# Patient Record
Sex: Female | Born: 1945 | Race: Black or African American | Hispanic: No | Smoking: Never smoker
Health system: Southern US, Community
[De-identification: ages and names within clinical notes are randomized; demographics above are authoritative.]

## PROBLEM LIST (undated history)

## (undated) DIAGNOSIS — E785 Hyperlipidemia, unspecified: Secondary | ICD-10-CM

## (undated) DIAGNOSIS — I609 Nontraumatic subarachnoid hemorrhage, unspecified: Secondary | ICD-10-CM

## (undated) DIAGNOSIS — Z972 Presence of dental prosthetic device (complete) (partial): Secondary | ICD-10-CM

## (undated) DIAGNOSIS — C801 Malignant (primary) neoplasm, unspecified: Secondary | ICD-10-CM

## (undated) DIAGNOSIS — E7800 Pure hypercholesterolemia, unspecified: Secondary | ICD-10-CM

## (undated) DIAGNOSIS — Z9889 Other specified postprocedural states: Secondary | ICD-10-CM

## (undated) DIAGNOSIS — I2692 Saddle embolus of pulmonary artery without acute cor pulmonale: Secondary | ICD-10-CM

## (undated) DIAGNOSIS — M109 Gout, unspecified: Secondary | ICD-10-CM

## (undated) DIAGNOSIS — J189 Pneumonia, unspecified organism: Secondary | ICD-10-CM

## (undated) DIAGNOSIS — D649 Anemia, unspecified: Secondary | ICD-10-CM

## (undated) DIAGNOSIS — Z974 Presence of external hearing-aid: Secondary | ICD-10-CM

## (undated) DIAGNOSIS — Z8719 Personal history of other diseases of the digestive system: Secondary | ICD-10-CM

## (undated) DIAGNOSIS — I82409 Acute embolism and thrombosis of unspecified deep veins of unspecified lower extremity: Secondary | ICD-10-CM

## (undated) DIAGNOSIS — M542 Cervicalgia: Secondary | ICD-10-CM

## (undated) DIAGNOSIS — M1990 Unspecified osteoarthritis, unspecified site: Secondary | ICD-10-CM

## (undated) DIAGNOSIS — Z87442 Personal history of urinary calculi: Secondary | ICD-10-CM

## (undated) DIAGNOSIS — R112 Nausea with vomiting, unspecified: Secondary | ICD-10-CM

## (undated) DIAGNOSIS — I639 Cerebral infarction, unspecified: Secondary | ICD-10-CM

## (undated) HISTORY — PX: IVC FILTER PLACEMENT (ARMC HX): HXRAD1551

## (undated) HISTORY — PX: REPLACEMENT TOTAL KNEE: SUR1224

## (undated) HISTORY — PX: CHOLECYSTECTOMY: SHX55

## (undated) HISTORY — PX: CATARACT EXTRACTION W/ INTRAOCULAR LENS IMPLANT: SHX1309

---

## 1998-07-23 HISTORY — PX: REPLACEMENT TOTAL KNEE: SUR1224

## 2004-07-21 ENCOUNTER — Emergency Department: Payer: Self-pay | Admitting: Emergency Medicine

## 2004-07-21 ENCOUNTER — Ambulatory Visit: Payer: Self-pay | Admitting: Family Medicine

## 2006-12-24 ENCOUNTER — Ambulatory Visit: Payer: Self-pay | Admitting: Internal Medicine

## 2008-02-24 ENCOUNTER — Ambulatory Visit: Payer: Self-pay | Admitting: Internal Medicine

## 2008-03-16 ENCOUNTER — Ambulatory Visit: Payer: Self-pay | Admitting: Gastroenterology

## 2009-04-13 ENCOUNTER — Ambulatory Visit: Payer: Self-pay | Admitting: Internal Medicine

## 2009-07-23 HISTORY — PX: CERVICAL BIOPSY  W/ LOOP ELECTRODE EXCISION: SUR135

## 2010-01-11 ENCOUNTER — Emergency Department: Payer: Self-pay | Admitting: Emergency Medicine

## 2010-06-07 ENCOUNTER — Ambulatory Visit: Payer: Self-pay | Admitting: Internal Medicine

## 2010-11-17 ENCOUNTER — Inpatient Hospital Stay: Payer: Self-pay | Admitting: Internal Medicine

## 2012-01-09 DIAGNOSIS — C539 Malignant neoplasm of cervix uteri, unspecified: Secondary | ICD-10-CM | POA: Insufficient documentation

## 2012-07-17 ENCOUNTER — Ambulatory Visit: Payer: Self-pay | Admitting: Internal Medicine

## 2012-11-20 ENCOUNTER — Ambulatory Visit: Payer: Self-pay | Admitting: Ophthalmology

## 2012-11-20 DIAGNOSIS — I10 Essential (primary) hypertension: Secondary | ICD-10-CM

## 2012-11-20 LAB — POTASSIUM: Potassium: 3.5 mmol/L (ref 3.5–5.1)

## 2012-12-02 ENCOUNTER — Ambulatory Visit: Payer: Self-pay | Admitting: Ophthalmology

## 2013-07-20 ENCOUNTER — Ambulatory Visit: Payer: Self-pay | Admitting: Internal Medicine

## 2013-07-26 ENCOUNTER — Emergency Department: Payer: Self-pay | Admitting: Emergency Medicine

## 2014-06-03 ENCOUNTER — Ambulatory Visit: Payer: Self-pay | Admitting: Internal Medicine

## 2014-06-04 ENCOUNTER — Other Ambulatory Visit (HOSPITAL_COMMUNITY): Payer: Self-pay | Admitting: Cardiology

## 2014-06-04 DIAGNOSIS — R0600 Dyspnea, unspecified: Secondary | ICD-10-CM

## 2014-06-07 ENCOUNTER — Ambulatory Visit: Payer: Self-pay | Admitting: Internal Medicine

## 2014-06-08 ENCOUNTER — Other Ambulatory Visit (INDEPENDENT_AMBULATORY_CARE_PROVIDER_SITE_OTHER): Payer: Medicare Other

## 2014-06-08 ENCOUNTER — Other Ambulatory Visit: Payer: Self-pay

## 2014-06-08 DIAGNOSIS — R0602 Shortness of breath: Secondary | ICD-10-CM

## 2014-06-08 DIAGNOSIS — R0600 Dyspnea, unspecified: Secondary | ICD-10-CM

## 2014-06-14 ENCOUNTER — Ambulatory Visit: Payer: Self-pay | Admitting: Gastroenterology

## 2014-07-02 DIAGNOSIS — E669 Obesity, unspecified: Secondary | ICD-10-CM | POA: Insufficient documentation

## 2014-07-02 DIAGNOSIS — Z9889 Other specified postprocedural states: Secondary | ICD-10-CM | POA: Insufficient documentation

## 2014-07-02 DIAGNOSIS — Z96659 Presence of unspecified artificial knee joint: Secondary | ICD-10-CM | POA: Insufficient documentation

## 2014-07-02 HISTORY — PX: HIATAL HERNIA REPAIR: SHX195

## 2014-07-02 HISTORY — PX: NISSEN FUNDOPLICATION: SHX2091

## 2014-07-31 DIAGNOSIS — I82409 Acute embolism and thrombosis of unspecified deep veins of unspecified lower extremity: Secondary | ICD-10-CM

## 2014-07-31 DIAGNOSIS — I2692 Saddle embolus of pulmonary artery without acute cor pulmonale: Secondary | ICD-10-CM | POA: Insufficient documentation

## 2014-07-31 HISTORY — DX: Saddle embolus of pulmonary artery without acute cor pulmonale: I26.92

## 2014-07-31 HISTORY — DX: Acute embolism and thrombosis of unspecified deep veins of unspecified lower extremity: I82.409

## 2014-08-07 DIAGNOSIS — Z95828 Presence of other vascular implants and grafts: Secondary | ICD-10-CM | POA: Insufficient documentation

## 2014-11-12 NOTE — Op Note (Signed)
PATIENT NAME:  Tasha Wilson, VANDERKOLK MR#:  [id number] DATE OF BIRTH:  08/17/1945  DATE OF PROCEDURE:  12/02/2012  PREOPERATIVE DIAGNOSIS: Visually significant cataract of the left eye.   POSTOPERATIVE DIAGNOSIS: Visually significant cataract of the left eye.   OPERATIVE PROCEDURE: Cataract extraction by phacoemulsification with implant of intraocular lens to left eye.   SURGEON: Birder Robson, MD   ANESTHESIA:  1. Managed anesthesia care.  2. Topical tetracaine drops followed by 2% Xylocaine jelly applied in the preoperative holding area.   COMPLICATIONS: None.   TECHNIQUE:  Stop and chop.   DESCRIPTION OF PROCEDURE: The patient was examined and consented in the preoperative holding area where the aforementioned topical anesthesia was applied to the left eye and then brought back to the Operating Room where the left eye was prepped and draped in the usual sterile ophthalmic fashion and a lid speculum was placed. A paracentesis was created with the side port blade and the anterior chamber was filled with viscoelastic. A near clear corneal incision was performed with the steel keratome. A continuous curvilinear capsulorrhexis was performed with a cystotome followed by the capsulorrhexis forceps. Hydrodissection and hydrodelineation were carried out with BSS on a blunt cannula. The lens was removed in a stop and chop technique and the remaining cortical material was removed with the irrigation-aspiration handpiece. The capsular bag was inflated with viscoelastic and the Tecnis ZCBOO   18.0-diopter lens, serial number [id number], was placed in the capsular bag without complication. The remaining viscoelastic was removed from the eye with the irrigation-aspiration handpiece. The wounds were hydrated. The anterior chamber was flushed with Miostat and the eye was inflated to physiologic pressure. 0.1 mL of cefuroxime concentration 10 mg/mL was placed in the anterior chamber. The wounds were found to be  water tight. The eye was dressed with Vigamox. The patient was given protective glasses to wear throughout the day and a shield with which to sleep tonight. The patient was also given drops with which to begin a drop regimen today and will follow-up with me in one day.   ____________________________ Livingston Diones. Analeia Ismael, MD wlp:cb D: 12/02/2012 20:53:02 ET T: 12/02/2012 21:56:18 ET JOB#: [id number]  cc: Gilda Abboud L. Darby Shadwick, MD, <Dictator> Livingston Diones Smith Potenza MD ELECTRONICALLY SIGNED 12/05/2012 9:00

## 2015-04-17 ENCOUNTER — Encounter: Payer: Self-pay | Admitting: Emergency Medicine

## 2015-04-17 ENCOUNTER — Emergency Department: Payer: Medicare HMO

## 2015-04-17 ENCOUNTER — Emergency Department
Admission: EM | Admit: 2015-04-17 | Discharge: 2015-04-17 | Disposition: A | Discharge status: Home / Self Care | Payer: Medicare HMO | Attending: Emergency Medicine | Admitting: Emergency Medicine

## 2015-04-17 DIAGNOSIS — R42 Dizziness and giddiness: Secondary | ICD-10-CM | POA: Diagnosis present

## 2015-04-17 DIAGNOSIS — R55 Syncope and collapse: Secondary | ICD-10-CM | POA: Diagnosis not present

## 2015-04-17 HISTORY — DX: Malignant (primary) neoplasm, unspecified: C80.1

## 2015-04-17 HISTORY — DX: Pure hypercholesterolemia, unspecified: E78.00

## 2015-04-17 HISTORY — DX: Anemia, unspecified: D64.9

## 2015-04-17 LAB — FIBRIN DERIVATIVES D-DIMER (ARMC ONLY): FIBRIN DERIVATIVES D-DIMER (ARMC): 299 (ref 0–499)

## 2015-04-17 LAB — COMPREHENSIVE METABOLIC PANEL
ALT: 21 U/L (ref 14–54)
ANION GAP: 9 (ref 5–15)
AST: 29 U/L (ref 15–41)
Albumin: 3.9 g/dL (ref 3.5–5.0)
Alkaline Phosphatase: 98 U/L (ref 38–126)
BUN: 21 mg/dL — ABNORMAL HIGH (ref 6–20)
CALCIUM: 9.6 mg/dL (ref 8.9–10.3)
CHLORIDE: 102 mmol/L (ref 101–111)
CO2: 26 mmol/L (ref 22–32)
CREATININE: 0.94 mg/dL (ref 0.44–1.00)
Glucose, Bld: 122 mg/dL — ABNORMAL HIGH (ref 65–99)
Potassium: 3.8 mmol/L (ref 3.5–5.1)
SODIUM: 137 mmol/L (ref 135–145)
Total Bilirubin: 0.7 mg/dL (ref 0.3–1.2)
Total Protein: 8.1 g/dL (ref 6.5–8.1)

## 2015-04-17 LAB — CBC WITH DIFFERENTIAL/PLATELET
BASOS PCT: 0 %
Basophils Absolute: 0 10*3/uL (ref 0–0.1)
EOS ABS: 0.1 10*3/uL (ref 0–0.7)
Eosinophils Relative: 1 %
HEMATOCRIT: 45.3 % (ref 35.0–47.0)
HEMOGLOBIN: 15 g/dL (ref 12.0–16.0)
LYMPHS ABS: 0.9 10*3/uL — AB (ref 1.0–3.6)
Lymphocytes Relative: 14 %
MCH: 29.7 pg (ref 26.0–34.0)
MCHC: 33.1 g/dL (ref 32.0–36.0)
MCV: 89.6 fL (ref 80.0–100.0)
Monocytes Absolute: 0.5 10*3/uL (ref 0.2–0.9)
Monocytes Relative: 8 %
NEUTROS ABS: 5 10*3/uL (ref 1.4–6.5)
NEUTROS PCT: 77 %
Platelets: 190 10*3/uL (ref 150–440)
RBC: 5.06 MIL/uL (ref 3.80–5.20)
RDW: 14.5 % (ref 11.5–14.5)
WBC: 6.4 10*3/uL (ref 3.6–11.0)

## 2015-04-17 LAB — PROTIME-INR
INR: 2.21
PROTHROMBIN TIME: 24.7 s — AB (ref 11.4–15.0)

## 2015-04-17 LAB — TROPONIN I

## 2015-04-17 MED ORDER — IOHEXOL 350 MG/ML SOLN
75.0000 mL | Freq: Once | INTRAVENOUS | Status: AC | PRN
  Administered 2015-04-17: 75 mL via INTRAVENOUS

## 2015-04-17 NOTE — ED Notes (Signed)
Pt presents to ED via [facility] from home c/o dizziness that started this morning around 0700. EMS reports pt unable to ambulate d/t feeling lightheaded but was able to stand and transfer. 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 Pt on coumadin. Denies pain.

## 2015-04-17 NOTE — ED Notes (Signed)
Discharge instructions and follow-up care reviewed with patient. No questions or concerns at this time. Pt stable at discharge.

## 2015-04-17 NOTE — ED Provider Notes (Signed)
Time Seen: Approximately 1205 I have reviewed the triage notes  Chief Complaint: Dizziness   History of Present Illness: Tasha Wilson is a 69 y.o. female who presents with feelings of lightheadedness without vertiginous symptoms. Patient was concerned because last time that she had similar symptoms that turned out to be a pulmonary embolism. Patient denies any chest pain or shortness of breath on this occasion the last time that she had a pulmonary embolism. She has been on Coumadin therapy since February of this year. She states she just had some adjustments on her Coumadin as an outpatient. She has history of anemia and denies any melena, hematochezia. She denies any chest pain, nausea, vomiting, fever. She denies any focal weakness in either upper or lower extremities. She denies any syncopal episode.   Past Medical History  Diagnosis Date  . Anemia   . High cholesterol   . Cancer     There are no active problems to display for this patient.   Past Surgical History  Procedure Laterality Date  . Replacement total knee Left     Past Surgical History  Procedure Laterality Date  . Replacement total knee Left     No current outpatient prescriptions on file.  Allergies:  Review of patient's allergies indicates no known allergies.  Family History: History reviewed. No pertinent family history.  Social History: Social History  Substance Use Topics  . Smoking status: Never Smoker   . Smokeless tobacco: None  . Alcohol Use: No     Review of Systems:   10 point review of systems was performed and was otherwise negative:  Constitutional: No fever Eyes: No visual disturbances ENT: No sore throat, ear pain Cardiac: No chest pain Respiratory: No shortness of breath, wheezing, or stridor Abdomen: No abdominal pain, no vomiting, No diarrhea Endocrine: No weight loss, No night sweats Extremities: No peripheral edema, cyanosis Skin: No rashes, easy  bruising Neurologic: No focal weakness, trouble with speech or swollowing Urologic: No dysuria, Hematuria, or urinary frequency   Physical Exam:  ED Triage Vitals  Enc Vitals Group     BP 04/17/15 1119 142/89 mmHg     Pulse Rate 04/17/15 1120 75     Resp 04/17/15 1119 12     Temp 04/17/15 1120 97.5 F (36.4 C)     Temp Source 04/17/15 1120 Oral     SpO2 04/17/15 1120 98 %     Weight 04/17/15 1120 252 lb 4.8 oz (114.443 kg)     Height 04/17/15 1120 5\' 5"  (1.651 m)     Head Cir --      Peak Flow --      Pain Score --      Pain Loc --      Pain Edu? --      Excl. in [place]? --     General: Awake , Alert , and Oriented times 3; GCS 15 Head: Normal cephalic , atraumatic Eyes: Pupils equal , round, reactive to light Nose/Throat: No nasal drainage, patent upper airway without erythema or exudate.  Neck: Supple, Full range of motion, No anterior adenopathy or palpable thyroid masses Lungs: Clear to ascultation without wheezes , rhonchi, or rales Heart: Regular rate, regular rhythm without murmurs , gallops , or rubs Abdomen: Soft, non tender without rebound, guarding , or rigidity; bowel sounds positive and symmetric in all 4 quadrants. No organomegaly .        Extremities: 2 plus symmetric pulses. No edema, clubbing or cyanosis Neurologic: normal  ambulation, Motor symmetric without deficits, sensory intact Skin: warm, dry, no rashes   Labs:   All laboratory work was reviewed including any pertinent negatives or positives listed below:  Labs Reviewed  COMPREHENSIVE METABOLIC PANEL - Abnormal; Notable for the following:    Glucose, Bld 122 (*)    BUN 21 (*)    All other components within normal limits  CBC WITH DIFFERENTIAL/PLATELET - Abnormal; Notable for the following:    Lymphs Abs 0.9 (*)    All other components within normal limits  PROTIME-INR - Abnormal; Notable for the following:    Prothrombin Time 24.7 (*)    All other components within normal limits  TROPONIN I   FIBRIN DERIVATIVES D-DIMER ([facility] ONLY)   review laboratory work shows a slightly elevated d-dimer level. Otherwise her INR is 2.21 which is therapeutic.  EKG:  ED ECG REPORT I, Daymon Larsen, the attending physician, personally viewed and interpreted this ECG.  Date: 04/17/2015 EKG Time: 1128 Rate: 71 Rhythm: normal sinus rhythm QRS Axis: Low voltage QRS Intervals: normal ST/T Wave abnormalities: Nonspecific T wave abnormality Conduction Disutrbances: none Narrative Interpretation: unremarkable No acute ischemic changes  Radiology  CT ANGIOGRAPHY CHEST WITH CONTRAST  TECHNIQUE: Multidetector CT imaging of the chest was performed using the standard protocol during bolus administration of intravenous contrast. Multiplanar CT image reconstructions and MIPs were obtained to evaluate the vascular anatomy.  CONTRAST: 26mL OMNIPAQUE IOHEXOL 350 MG/ML SOLN  COMPARISON: CTA chest dated 06/03/2014  FINDINGS: No evidence of pulmonary embolism.  Mediastinum/Nodes: Heart is normal in size. No pericardial effusion.  Three-vessel coronary atherosclerosis.  Mild atherosclerotic calcifications of the aortic arch.  No suspicious mediastinal, hilar, or axillary lymphadenopathy.  Visualized thyroid is unremarkable.  Lungs/Pleura: Lungs are clear.  No suspicious pulmonary nodules.  Mild dependent atelectasis in the medial right lower lobe. No focal consolidation.  No pleural effusion or pneumothorax.  Upper abdomen: Visualized upper abdomen is notable for cholecystectomy clips and postsurgical changes related to prior Nissen fundoplication.  Musculoskeletal: Degenerative changes of the visualized thoracolumbar spine.  Review of the MIP images confirms the above findings.  IMPRESSION: No evidence of pulmonary embolism.  No evidence of acute cardiopulmonary disease.   I personally reviewed the radiologic studies     ED Course:  Patient's differential for near  syncope is extensive. I felt this was unlikely to be a life-threatening disease at this time. There is no evidence of pulmonary embolism on CAT scan evaluation. Patient's not hypoxic, etc. She may be slightly dehydrated based on her history but otherwise seems to be in good physical condition at this time. Her dizziness did not seem to be vertiginous in nature. I felt this was unlikely to be cardiac arrhythmias, etc.   Assessment Near syncope   Final Clinical Impression:  Near syncope Final diagnoses:  Near syncope     Plan:  Outpatient management Patient was advised to return immediately if condition worsens. Patient was advised to follow up with her primary care physician or other specialized physicians involved and in their current assessment.            Daymon Larsen, MD 04/17/15 [phone]

## 2015-04-17 NOTE — Discharge Instructions (Signed)
Near-Syncope Near-syncope (commonly known as near fainting) is sudden weakness, dizziness, or feeling like you might pass out. During an episode of near-syncope, you may also develop pale skin, have tunnel vision, or feel sick to your stomach (nauseous). Near-syncope may occur when getting up after sitting or while standing for a long time. It is caused by a sudden decrease in blood flow to the brain. This decrease can result from various causes or triggers, most of which are not serious. However, because near-syncope can sometimes be a sign of something serious, a medical evaluation is required. The specific cause is often not determined. HOME CARE INSTRUCTIONS  Monitor your condition for any changes. The following actions may help to alleviate any discomfort you are experiencing:  Have someone stay with you until you feel stable.  Lie down right away and prop your feet up if you start feeling like you might faint. Breathe deeply and steadily. Wait until all the symptoms have passed. Most of these episodes last only a few minutes. You may feel tired for several hours.   Drink enough fluids to keep your urine clear or pale yellow.   If you are taking blood pressure or heart medicine, get up slowly when seated or lying down. Take several minutes to sit and then stand. This can reduce dizziness.  Follow up with your health care provider as directed. SEEK IMMEDIATE MEDICAL CARE IF:   You have a severe headache.   You have unusual pain in the chest, abdomen, or back.   You are bleeding from the mouth or rectum, or you have black or tarry stool.   You have an irregular or very fast heartbeat.   You have repeated fainting or have seizure-like jerking during an episode.   You faint when sitting or lying down.   You have confusion.   You have difficulty walking.   You have severe weakness.   You have vision problems.  MAKE SURE YOU:   Understand these instructions.  Will  watch your condition.  Will get help right away if you are not doing well or get worse. Document Released: 07/09/2005 Document Revised: 07/14/2013 Document Reviewed: 12/12/2012 [facility] Patient Information 2015 [place], [place]. This information is not intended to replace advice given to you by your health care provider. Make sure you discuss any questions you have with your health care provider.   Please return immediately if condition worsens. Please contact her primary physician or the physician you were given for referral. If you have any specialist physicians involved in her treatment and plan please also contact them. Thank you for using [place] [facility] emergency [facility]. INR was 2.21.

## 2015-07-01 ENCOUNTER — Other Ambulatory Visit: Payer: Self-pay | Admitting: Internal Medicine

## 2015-07-01 DIAGNOSIS — Z1231 Encounter for screening mammogram for malignant neoplasm of breast: Secondary | ICD-10-CM

## 2015-07-12 ENCOUNTER — Ambulatory Visit
Admission: RE | Admit: 2015-07-12 | Discharge: 2015-07-12 | Disposition: A | Discharge status: Home / Self Care | Payer: Medicare HMO | Source: Ambulatory Visit | Attending: Internal Medicine | Admitting: Internal Medicine

## 2015-07-12 ENCOUNTER — Other Ambulatory Visit: Payer: Self-pay | Admitting: Internal Medicine

## 2015-07-12 DIAGNOSIS — Z1231 Encounter for screening mammogram for malignant neoplasm of breast: Secondary | ICD-10-CM | POA: Insufficient documentation

## 2015-07-30 DIAGNOSIS — Z923 Personal history of irradiation: Secondary | ICD-10-CM | POA: Insufficient documentation

## 2015-07-30 DIAGNOSIS — IMO0002 Reserved for concepts with insufficient information to code with codable children: Secondary | ICD-10-CM | POA: Insufficient documentation

## 2015-12-13 ENCOUNTER — Emergency Department: Payer: No Typology Code available for payment source

## 2015-12-13 ENCOUNTER — Emergency Department
Admission: EM | Admit: 2015-12-13 | Discharge: 2015-12-13 | Disposition: A | Discharge status: Home / Self Care | Payer: No Typology Code available for payment source | Attending: Emergency Medicine | Admitting: Emergency Medicine

## 2015-12-13 ENCOUNTER — Encounter: Payer: Self-pay | Admitting: Emergency Medicine

## 2015-12-13 DIAGNOSIS — M545 Low back pain, unspecified: Secondary | ICD-10-CM

## 2015-12-13 DIAGNOSIS — S169XXA Unspecified injury of muscle, fascia and tendon at neck level, initial encounter: Secondary | ICD-10-CM | POA: Diagnosis present

## 2015-12-13 DIAGNOSIS — S161XXA Strain of muscle, fascia and tendon at neck level, initial encounter: Secondary | ICD-10-CM | POA: Diagnosis not present

## 2015-12-13 DIAGNOSIS — Y999 Unspecified external cause status: Secondary | ICD-10-CM | POA: Diagnosis not present

## 2015-12-13 DIAGNOSIS — Y9389 Activity, other specified: Secondary | ICD-10-CM | POA: Diagnosis not present

## 2015-12-13 DIAGNOSIS — Z8541 Personal history of malignant neoplasm of cervix uteri: Secondary | ICD-10-CM | POA: Diagnosis not present

## 2015-12-13 DIAGNOSIS — Y92410 Unspecified street and highway as the place of occurrence of the external cause: Secondary | ICD-10-CM | POA: Diagnosis not present

## 2015-12-13 DIAGNOSIS — Z7901 Long term (current) use of anticoagulants: Secondary | ICD-10-CM | POA: Diagnosis not present

## 2015-12-13 MED ORDER — TRAMADOL HCL 50 MG PO TABS
50.0000 mg | ORAL_TABLET | Freq: Once | ORAL | Status: AC
  Administered 2015-12-13: 50 mg via ORAL
  Filled 2015-12-13: qty 1

## 2015-12-13 MED ORDER — IBUPROFEN 600 MG PO TABS: 600.0000 mg | ORAL_TABLET | Freq: Four times a day (QID) | ORAL | Status: DC | PRN

## 2015-12-13 MED ORDER — CYCLOBENZAPRINE HCL 10 MG PO TABS
10.0000 mg | ORAL_TABLET | Freq: Once | ORAL | Status: AC
Start: 2015-12-13 — End: 2015-12-13
  Administered 2015-12-13: 10 mg via ORAL
  Filled 2015-12-13: qty 1

## 2015-12-13 MED ORDER — CYCLOBENZAPRINE HCL 10 MG PO TABS: 10.0000 mg | ORAL_TABLET | Freq: Three times a day (TID) | ORAL | Status: DC | PRN

## 2015-12-13 MED ORDER — TRAMADOL HCL 50 MG PO TABS: 50.0000 mg | ORAL_TABLET | Freq: Four times a day (QID) | ORAL | Status: DC | PRN
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
## 2015-12-27 ENCOUNTER — Other Ambulatory Visit: Payer: Self-pay

## 2015-12-27 ENCOUNTER — Telehealth: Payer: Self-pay | Admitting: Gastroenterology

## 2015-12-27 MED ORDER — PEG 3350-KCL-NABCB-NACL-NASULF 236 G PO SOLR: 4000.0000 mL | Freq: Once | ORAL | Status: DC

## 2015-12-27 NOTE — Telephone Encounter (Signed)
Gastroenterology Pre-Procedure Review  Request Date: 12/30/15 Requesting Physician: Dr. Rosario Jacks  PATIENT REVIEW QUESTIONS: The patient responded to the following health history questions as indicated:    1. Are you having any GI issues? yes (Rectal bleeding) 2. Do you have a personal history of Polyps? no 3. Do you have a family history of Colon Cancer or Polyps? no 4. Diabetes Mellitus? no 5. Joint replacements in the past 12 months?no 6. Major health problems in the past 3 months?no 7. Any artificial heart valves, MVP, or defibrillator?no    MEDICATIONS & ALLERGIES:    Patient reports the following regarding taking any anticoagulation/antiplatelet therapy:   Plavix, Coumadin, Eliquis, Xarelto, Lovenox, Pradaxa, Brilinta, or Effient? yes (Coumadin) Aspirin? no  Patient confirms/reports the following medications:  Current Outpatient Prescriptions  Medication Sig Dispense Refill  . cyclobenzaprine (FLEXERIL) 10 MG tablet Take 1 tablet (10 mg total) by mouth 3 (three) times daily as needed for muscle spasms. 30 tablet 0  . ibuprofen (ADVIL,MOTRIN) 600 MG tablet Take 1 tablet (600 mg total) by mouth every 6 (six) hours as needed. 30 tablet 0  . traMADol (ULTRAM) 50 MG tablet Take 1 tablet (50 mg total) by mouth every 6 (six) hours as needed. 12 tablet 0  . warfarin (COUMADIN) 5 MG tablet Take 5 mg by mouth daily.     No current facility-administered medications for this visit.    Patient confirms/reports the following allergies:  No Known Allergies  No orders of the defined types were placed in this encounter.    AUTHORIZATION INFORMATION Primary Insurance: 1D#: Group #:  Secondary Insurance: 1D#: Group #:  SCHEDULE INFORMATION: Date: 12/30/15 Time: Location: [place]

## 2015-12-27 NOTE — Telephone Encounter (Signed)
Colonoscopy scheduled at [facility] on 12/30/15 for rectal bleeding K62.5.   Email daughter prep instructions: [email or web address].

## 2015-12-28 ENCOUNTER — Encounter: Payer: Self-pay | Admitting: *Deleted

## 2015-12-30 ENCOUNTER — Encounter
Admission: RE | Disposition: A | Discharge status: Home / Self Care | Payer: Self-pay | Source: Ambulatory Visit | Attending: Gastroenterology

## 2015-12-30 ENCOUNTER — Ambulatory Visit: Payer: Medicare HMO | Admitting: Anesthesiology

## 2015-12-30 ENCOUNTER — Ambulatory Visit
Admission: RE | Admit: 2015-12-30 | Discharge: 2015-12-30 | Disposition: A | Discharge status: Home / Self Care | Payer: Medicare HMO | Source: Ambulatory Visit | Attending: Gastroenterology | Admitting: Gastroenterology

## 2015-12-30 DIAGNOSIS — E7800 Pure hypercholesterolemia, unspecified: Secondary | ICD-10-CM | POA: Diagnosis not present

## 2015-12-30 DIAGNOSIS — Z86718 Personal history of other venous thrombosis and embolism: Secondary | ICD-10-CM | POA: Diagnosis not present

## 2015-12-30 DIAGNOSIS — M1990 Unspecified osteoarthritis, unspecified site: Secondary | ICD-10-CM | POA: Diagnosis not present

## 2015-12-30 DIAGNOSIS — Z862 Personal history of diseases of the blood and blood-forming organs and certain disorders involving the immune mechanism: Secondary | ICD-10-CM | POA: Diagnosis not present

## 2015-12-30 DIAGNOSIS — Z86711 Personal history of pulmonary embolism: Secondary | ICD-10-CM | POA: Insufficient documentation

## 2015-12-30 DIAGNOSIS — Z7901 Long term (current) use of anticoagulants: Secondary | ICD-10-CM | POA: Diagnosis not present

## 2015-12-30 DIAGNOSIS — K641 Second degree hemorrhoids: Secondary | ICD-10-CM | POA: Diagnosis not present

## 2015-12-30 DIAGNOSIS — Z96652 Presence of left artificial knee joint: Secondary | ICD-10-CM | POA: Insufficient documentation

## 2015-12-30 DIAGNOSIS — K921 Melena: Secondary | ICD-10-CM | POA: Diagnosis present

## 2015-12-30 DIAGNOSIS — Z79899 Other long term (current) drug therapy: Secondary | ICD-10-CM | POA: Insufficient documentation

## 2015-12-30 HISTORY — DX: Acute embolism and thrombosis of unspecified deep veins of unspecified lower extremity: I82.409

## 2015-12-30 HISTORY — PX: COLONOSCOPY WITH PROPOFOL: SHX5780

## 2015-12-30 HISTORY — DX: Cervicalgia: M54.2

## 2015-12-30 HISTORY — DX: Saddle embolus of pulmonary artery without acute cor pulmonale: I26.92

## 2015-12-30 HISTORY — DX: Presence of external hearing-aid: Z97.4

## 2015-12-30 HISTORY — DX: Unspecified osteoarthritis, unspecified site: M19.90

## 2015-12-30 HISTORY — DX: Presence of dental prosthetic device (complete) (partial): Z97.2

## 2015-12-30 SURGERY — COLONOSCOPY WITH PROPOFOL
Anesthesia: Monitor Anesthesia Care | Wound class: Contaminated

## 2015-12-30 MED ORDER — LACTATED RINGERS IV SOLN
INTRAVENOUS | Status: DC
Start: 2015-12-30 — End: 2015-12-30
  Administered 2015-12-30: 10:00:00 via INTRAVENOUS

## 2015-12-30 MED ORDER — LIDOCAINE HCL (CARDIAC) 20 MG/ML IV SOLN
INTRAVENOUS | Status: DC | PRN
  Administered 2015-12-30: 40 mg via INTRAVENOUS

## 2015-12-30 MED ORDER — SIMETHICONE 40 MG/0.6ML PO SUSP
ORAL | Status: DC | PRN
  Administered 2015-12-30: 11:00:00

## 2015-12-30 MED ORDER — PROPOFOL 10 MG/ML IV BOLUS
INTRAVENOUS | Status: DC | PRN
  Administered 2015-12-30: 25 mg via INTRAVENOUS
  Administered 2015-12-30: 100 mg via INTRAVENOUS
  Administered 2015-12-30 (×3): 25 mg via INTRAVENOUS

## 2015-12-30 SURGICAL SUPPLY — 22 items
CANISTER SUCT 1200ML W/VALVE (MISCELLANEOUS) ×3 IMPLANT
CLIP HMST 235XBRD CATH ROT (MISCELLANEOUS) IMPLANT
CLIP RESOLUTION 360 11X235 (MISCELLANEOUS)
FCP ESCP3.2XJMB 240X2.8X (MISCELLANEOUS)
FORCEPS BIOP RAD 4 LRG CAP 4 (CUTTING FORCEPS) IMPLANT
FORCEPS BIOP RJ4 240 W/NDL (MISCELLANEOUS)
FORCEPS ESCP3.2XJMB 240X2.8X (MISCELLANEOUS) IMPLANT
GOWN CVR UNV OPN BCK APRN NK (MISCELLANEOUS) ×2 IMPLANT
GOWN ISOL THUMB LOOP REG UNIV (MISCELLANEOUS) ×4
INJECTOR VARIJECT VIN23 (MISCELLANEOUS) IMPLANT
KIT DEFENDO VALVE AND CONN (KITS) IMPLANT
KIT ENDO PROCEDURE OLY (KITS) ×3 IMPLANT
MARKER SPOT ENDO TATTOO 5ML (MISCELLANEOUS) IMPLANT
PAD GROUND ADULT SPLIT (MISCELLANEOUS) IMPLANT
PROBE APC STR FIRE (PROBE) IMPLANT
SNARE SHORT THROW 13M SML OVAL (MISCELLANEOUS) IMPLANT
SNARE SHORT THROW 30M LRG OVAL (MISCELLANEOUS) IMPLANT
SNARE SNG USE RND 15MM (INSTRUMENTS) IMPLANT
SPOT EX ENDOSCOPIC TATTOO (MISCELLANEOUS)
TRAP ETRAP POLY (MISCELLANEOUS) IMPLANT
VARIJECT INJECTOR VIN23 (MISCELLANEOUS)
WATER STERILE IRR 250ML POUR (IV SOLUTION) ×3 IMPLANT

## 2015-12-30 NOTE — Anesthesia Procedure Notes (Signed)
Procedure Name: MAC Performed by: Mellanie Bejarano Pre-anesthesia Checklist: Patient identified, Emergency Drugs available, Suction available, Patient being monitored and Timeout performed Patient Re-evaluated:Patient Re-evaluated prior to inductionOxygen Delivery Method: Nasal cannula       

## 2015-12-30 NOTE — Anesthesia Preprocedure Evaluation (Signed)
Anesthesia Evaluation  Patient identified by MRN, date of birth, ID band Patient awake    Reviewed: Allergy & Precautions, H&P , NPO status , Patient's Chart, lab work & pertinent test results  History of Anesthesia Complications (+) POST - OP SPINAL HEADACHE  Airway Mallampati: II  TM Distance: >3 FB Neck ROM: full    Dental no notable dental hx.    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   Anesthesia Other Findings   Reproductive/Obstetrics                             Anesthesia Physical Anesthesia Plan  ASA: III  Anesthesia Plan: MAC   Post-op Pain Management:    Induction:   Airway Management Planned:   Additional Equipment:   Intra-op Plan:   Post-operative Plan:   Informed Consent: I have reviewed the patients History and Physical, chart, labs and discussed the procedure including the risks, benefits and alternatives for the proposed anesthesia with the patient or authorized representative who has indicated his/her understanding and acceptance.     Plan Discussed with: CRNA  Anesthesia Plan Comments:         Anesthesia Quick Evaluation
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
## 2015-12-30 NOTE — Transfer of Care (Signed)
Immediate Anesthesia Transfer of Care Note  Patient: Tasha Wilson  Procedure(s) Performed: Procedure(s): COLONOSCOPY WITH PROPOFOL (N/A)  Patient Location: PACU  Anesthesia Type: MAC  Level of Consciousness: awake, alert  and patient cooperative  Airway and Oxygen Therapy: Patient Spontanous Breathing and Patient connected to supplemental oxygen  Post-op Assessment: Post-op Vital signs reviewed, Patient's Cardiovascular Status Stable, Respiratory Function Stable, Patent Airway and No signs of Nausea or vomiting  Post-op Vital Signs: Reviewed and stable  Complications: No apparent anesthesia complications

## 2015-12-30 NOTE — Discharge Instructions (Signed)

## 2015-12-30 NOTE — Anesthesia Postprocedure Evaluation (Signed)
Anesthesia Post Note  Patient: Tasha Wilson  Procedure(s) Performed: Procedure(s) (LRB): COLONOSCOPY WITH PROPOFOL (N/A)  Patient location during evaluation: PACU Anesthesia Type: MAC Level of consciousness: awake and alert Pain management: pain level controlled Vital Signs Assessment: post-procedure vital signs reviewed and stable Respiratory status: spontaneous breathing and respiratory function stable Cardiovascular status: stable Postop Assessment: no headache Anesthetic complications: no    Jaci Standard, III,  Lido Maske D

## 2015-12-30 NOTE — Op Note (Signed)
[facility] Patient Name: Tasha Wilson Procedure Date: 12/30/2015 11:18 AM MRN: [id number] Account #: [id number] Date of Birth: 04/17/1946 Admit Type: Outpatient Age: 70 Room: [facility] OR ROOM 01 Gender: Female Note Status: Finalized Procedure:            Colonoscopy Indications:          Hematochezia Providers:            Lucilla Lame, MD Referring MD:         Casilda Carls, MD (Referring MD) Medicines:            Propofol per Anesthesia Complications:        No immediate complications. Procedure:            Pre-Anesthesia Assessment:                       - Prior to the procedure, a History and Physical was                        performed, and patient medications and allergies were                        reviewed. The patient's tolerance of previous                        anesthesia was also reviewed. The risks and benefits of                        the procedure and the sedation options and risks were                        discussed with the patient. All questions were                        answered, and informed consent was obtained. Prior                        Anticoagulants: The patient has taken no previous                        anticoagulant or antiplatelet agents. ASA Grade                        Assessment: II - A patient with mild systemic disease.                        After reviewing the risks and benefits, the patient was                        deemed in satisfactory condition to undergo the                        procedure.                       After obtaining informed consent, the colonoscope was                        passed under direct vision. Throughout the procedure,  the patient's blood pressure, pulse, and oxygen                        saturations were monitored continuously. The Olympus                        CF-HQ190L Colonoscope (S#. [id number]) was introduced                        through the  anus and advanced to the the cecum,                        identified by appendiceal orifice and ileocecal valve.                        The colonoscopy was performed without difficulty. The                        patient tolerated the procedure well. The quality of                        the bowel preparation was fair. Findings:      The perianal and digital rectal examinations were normal.      Non-bleeding internal hemorrhoids were found during retroflexion. The       hemorrhoids were Grade II (internal hemorrhoids that prolapse but reduce       spontaneously). Impression:           - Preparation of the colon was fair.                       - Non-bleeding internal hemorrhoids.                       - No specimens collected. Recommendation:       - High fiber diet. Procedure Code(s):    --- Professional ---                       [phone], Colonoscopy, flexible; diagnostic, including                        collection of specimen(s) by brushing or washing, when                        performed (separate procedure) Diagnosis Code(s):    --- Professional ---                       K92.1, Melena (includes Hematochezia) CPT copyright 2016 American Medical Association. All rights reserved. The codes documented in this report are preliminary and upon coder review may  be revised to meet current compliance requirements. Lucilla Lame, MD 12/30/2015 11:36:07 AM This report has been signed electronically. Number of Addenda: 0 Note Initiated On: 12/30/2015 11:18 AM Scope Withdrawal Time: 0 hours 4 minutes 55 seconds  Total Procedure Duration: 0 hours 6 minutes 49 seconds       [facility]

## 2016-01-02 ENCOUNTER — Encounter: Payer: Self-pay | Admitting: Gastroenterology

## 2016-01-02 ENCOUNTER — Telehealth: Payer: Self-pay

## 2016-01-02 NOTE — Telephone Encounter (Signed)
Dr. Guerry Bruin office called requesting for a copy of patient's colonoscopy results from 12/30/2015. They wanted me to fax it to [phone].

## 2016-06-13 ENCOUNTER — Other Ambulatory Visit: Payer: Self-pay | Admitting: Family Medicine

## 2016-06-13 DIAGNOSIS — Z1231 Encounter for screening mammogram for malignant neoplasm of breast: Secondary | ICD-10-CM

## 2016-07-24 ENCOUNTER — Ambulatory Visit
Admission: RE | Admit: 2016-07-24 | Discharge: 2016-07-24 | Disposition: A | Discharge status: Home / Self Care | Payer: Medicare HMO | Source: Ambulatory Visit | Attending: Family Medicine | Admitting: Family Medicine

## 2016-07-24 DIAGNOSIS — Z1231 Encounter for screening mammogram for malignant neoplasm of breast: Secondary | ICD-10-CM | POA: Insufficient documentation

## 2016-11-05 ENCOUNTER — Other Ambulatory Visit: Payer: Self-pay | Admitting: Family Medicine

## 2016-11-05 DIAGNOSIS — Z780 Asymptomatic menopausal state: Secondary | ICD-10-CM

## 2016-12-18 ENCOUNTER — Other Ambulatory Visit: Payer: Medicare HMO

## 2016-12-25 ENCOUNTER — Encounter
Admission: RE | Admit: 2016-12-25 | Discharge: 2016-12-25 | Disposition: A | Discharge status: Home / Self Care | Payer: Medicare HMO | Source: Ambulatory Visit | Attending: Internal Medicine | Admitting: Internal Medicine

## 2017-01-11 ENCOUNTER — Non-Acute Institutional Stay (SKILLED_NURSING_FACILITY): Payer: Medicare HMO | Admitting: Gerontology

## 2017-01-11 DIAGNOSIS — I609 Nontraumatic subarachnoid hemorrhage, unspecified: Secondary | ICD-10-CM

## 2017-01-23 DIAGNOSIS — I609 Nontraumatic subarachnoid hemorrhage, unspecified: Secondary | ICD-10-CM | POA: Insufficient documentation
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
## 2017-02-28 ENCOUNTER — Other Ambulatory Visit: Payer: Self-pay | Admitting: Family Medicine

## 2017-02-28 DIAGNOSIS — I639 Cerebral infarction, unspecified: Secondary | ICD-10-CM

## 2017-03-06 ENCOUNTER — Ambulatory Visit: Payer: Medicare HMO

## 2017-03-11 ENCOUNTER — Ambulatory Visit: Payer: Medicare HMO

## 2017-03-15 ENCOUNTER — Ambulatory Visit
Admission: RE | Admit: 2017-03-15 | Discharge: 2017-03-15 | Disposition: A | Discharge status: Home / Self Care | Payer: Medicare HMO | Source: Ambulatory Visit | Attending: Family Medicine | Admitting: Family Medicine

## 2017-03-15 DIAGNOSIS — G9389 Other specified disorders of brain: Secondary | ICD-10-CM | POA: Diagnosis not present

## 2017-03-15 DIAGNOSIS — Z8673 Personal history of transient ischemic attack (TIA), and cerebral infarction without residual deficits: Secondary | ICD-10-CM | POA: Insufficient documentation

## 2017-03-15 DIAGNOSIS — I639 Cerebral infarction, unspecified: Secondary | ICD-10-CM

## 2017-03-15 LAB — POCT I-STAT CREATININE: Creatinine, Ser: 0.7 mg/dL (ref 0.44–1.00)

## 2017-03-15 MED ORDER — GADOBENATE DIMEGLUMINE 529 MG/ML IV SOLN
20.0000 mL | Freq: Once | INTRAVENOUS | Status: AC | PRN
  Administered 2017-03-15: 20 mL via INTRAVENOUS

## 2017-05-29 ENCOUNTER — Encounter: Payer: Self-pay | Admitting: Urology

## 2017-05-29 ENCOUNTER — Ambulatory Visit: Payer: Medicare HMO | Admitting: Urology

## 2017-09-02 ENCOUNTER — Other Ambulatory Visit: Payer: Self-pay | Admitting: Family Medicine

## 2017-09-02 DIAGNOSIS — Z1231 Encounter for screening mammogram for malignant neoplasm of breast: Secondary | ICD-10-CM
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
## 2017-09-27 ENCOUNTER — Ambulatory Visit
Admission: RE | Admit: 2017-09-27 | Discharge: 2017-09-27 | Disposition: A | Discharge status: Home / Self Care | Payer: Medicare HMO | Source: Ambulatory Visit | Attending: Family Medicine | Admitting: Family Medicine

## 2017-09-27 DIAGNOSIS — Z1231 Encounter for screening mammogram for malignant neoplasm of breast: Secondary | ICD-10-CM

## 2017-12-20 ENCOUNTER — Ambulatory Visit: Payer: Medicare HMO

## 2017-12-26 ENCOUNTER — Other Ambulatory Visit: Payer: Self-pay | Admitting: Family Medicine

## 2017-12-26 DIAGNOSIS — Z780 Asymptomatic menopausal state: Secondary | ICD-10-CM

## 2018-01-15 ENCOUNTER — Encounter: Payer: Self-pay | Admitting: Urology

## 2018-01-15 ENCOUNTER — Ambulatory Visit (INDEPENDENT_AMBULATORY_CARE_PROVIDER_SITE_OTHER): Payer: Medicare Other | Admitting: Urology

## 2018-01-15 VITALS — BP 124/84 | HR 80 | Ht 62.0 in | Wt 242.6 lb

## 2018-01-15 DIAGNOSIS — N3289 Other specified disorders of bladder: Secondary | ICD-10-CM

## 2018-01-15 DIAGNOSIS — R9341 Abnormal radiologic findings on diagnostic imaging of renal pelvis, ureter, or bladder: Secondary | ICD-10-CM | POA: Diagnosis not present

## 2018-01-15 NOTE — Progress Notes (Signed)
01/15/2018 12:07 PM   Tasha Wilson May 23, 1946 [id number]  Referring provider: Donnie Coffin, MD [place], [place] [address]  Chief Complaint  Patient presents with  . Other    HPI: Tasha Wilson is a 72 year old female referred for evaluation of an abnormal CT of the pelvis.  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  She was initially referred last year however did not keep that appointment.  She currently has no complaints.  She has no bothersome lower urinary tract symptoms.  Denies dysuria, gross hematuria or urinary incontinence.  She denies flank, abdominal or pelvic pain.  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  Denies previous history of urologic problems.   PMH: Past Medical History:  Diagnosis Date  . Anemia   . Cancer (HCC)    cervical- chemo/radiation  . Deep vein thrombosis (DVT) of lower extremity ([place]) 07/31/14   Bilateral - [facility]  . High cholesterol   . Neck pain    s/p accident on 12/13/15.  has been seen by PCP  . Osteoarthritis    legs  . 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 Wears dentures    partial upper and lower  . Wears hearing aid    left    Surgical History: Past Surgical History:  Procedure Laterality Date  . COLONOSCOPY WITH PROPOFOL N/A 12/30/2015   Procedure: COLONOSCOPY WITH PROPOFOL;  Surgeon: Lucilla Lame, MD;  Location: [facility];  Service: Endoscopy;  Laterality: N/A;  . HIATAL HERNIA REPAIR  07/02/14   [facility]  . IVC FILTER PLACEMENT ([place] HX)     [facility] - placed 08/02/14, removed 09/28/14  . NISSEN FUNDOPLICATION  03/55/97   [facility]  . REPLACEMENT TOTAL KNEE Left     Home Medications:  Allergies as of 01/15/2018   No Known Allergies     Medication List        Accurate as of 01/15/18 12:07 PM. Always use  your most recent med list.          acetaminophen 325 MG tablet Commonly known as:  TYLENOL Take by mouth.   ELIQUIS 5 MG Tabs tablet Generic drug:  apixaban   levETIRAcetam 750 MG tablet Commonly known as:  KEPPRA   lovastatin 20 MG tablet Commonly known as:  MEVACOR Take 20 mg by mouth daily.   Vitamin D3 1000 units Caps Take by mouth.       Allergies: No Known Allergies  Family History: Family History  Problem Relation Age of Onset  . Breast cancer Neg Hx     Social History:  reports that she has never smoked. She has never used smokeless tobacco. She reports that she does not drink alcohol or use drugs.  ROS: UROLOGY Frequent Urination?: No Hard to postpone urination?: No Burning/pain with urination?: No Get up at night to urinate?: Yes Leakage of urine?: No Urine stream starts and stops?: No Trouble starting stream?: No Do you have to strain to urinate?: No Blood in urine?: No Urinary tract infection?: No Sexually transmitted disease?: No Injury to kidneys or bladder?: No Painful intercourse?: No Weak stream?: No Currently pregnant?: No Vaginal bleeding?: No Last menstrual period?: Postmenopausal  Gastrointestinal Nausea?: No Vomiting?: No Indigestion/heartburn?: No Diarrhea?: No Constipation?: No  Constitutional Fever: No Night sweats?: No  Weight loss?: No Fatigue?: No  Skin Skin rash/lesions?: No Itching?: No  Eyes Blurred vision?: No Double vision?: No  Ears/Nose/Throat Sore throat?: No Sinus problems?: No  Hematologic/Lymphatic Swollen glands?: No Easy bruising?: No  Cardiovascular Leg swelling?: No Chest pain?: No  Respiratory Cough?: No Shortness of breath?: No  Endocrine Excessive thirst?: No  Musculoskeletal Back pain?: No Joint pain?: No  Neurological Headaches?: No Dizziness?: No  Psychologic Depression?: No Anxiety?: No  Physical Exam: BP 124/84 (BP Location: Right Arm, Patient Position:  Sitting, Cuff Size: Large)   Pulse 80   Ht 5\' 2"  (1.575 m)   Wt 242 lb 9.6 oz (110 kg)   BMI 44.37 kg/m   Constitutional:  Alert and oriented, No acute distress. HEENT: [place] AT, moist mucus membranes.  Trachea midline, no masses. Cardiovascular: No clubbing, cyanosis, or edema. Respiratory: Normal respiratory effort, no increased work of breathing. GI: Abdomen is soft, nontender, nondistended, no abdominal masses GU: No CVA tenderness Lymph: No cervical or inguinal lymphadenopathy. Skin: No rashes, bruises or suspicious lesions. Neurologic: Grossly intact, no focal deficits, moving all 4 extremities. Psychiatric: Normal mood and affect.   Assessment & Plan:   Her CT was performed within the [facility] system and the images are not available for review.  Bladder wall thickening on CT is nonspecific and feel this is most likely not pathologic.  I did discuss with Ms. Bruss the best evaluation would be cystoscopy and she has elected to schedule.  Abbie Sons, [place] [address] [place], [place] [address] [phone]

## 2018-01-22 ENCOUNTER — Other Ambulatory Visit: Payer: Medicare HMO

## 2018-02-27 ENCOUNTER — Other Ambulatory Visit: Payer: Medicare Other | Admitting: Urology

## 2022-09-08 ENCOUNTER — Emergency Department
Admission: EM | Admit: 2022-09-08 | Discharge: 2022-09-08 | Disposition: A | Discharge status: Home / Self Care | Payer: Medicare Other | Attending: Emergency Medicine | Admitting: Emergency Medicine

## 2022-09-08 ENCOUNTER — Encounter: Payer: Self-pay | Admitting: Emergency Medicine

## 2022-09-08 ENCOUNTER — Other Ambulatory Visit: Payer: Self-pay

## 2022-09-08 ENCOUNTER — Emergency Department: Payer: Medicare Other

## 2022-09-08 DIAGNOSIS — R319 Hematuria, unspecified: Secondary | ICD-10-CM

## 2022-09-08 DIAGNOSIS — N132 Hydronephrosis with renal and ureteral calculous obstruction: Secondary | ICD-10-CM | POA: Insufficient documentation

## 2022-09-08 DIAGNOSIS — Z8541 Personal history of malignant neoplasm of cervix uteri: Secondary | ICD-10-CM | POA: Diagnosis not present

## 2022-09-08 DIAGNOSIS — N201 Calculus of ureter: Secondary | ICD-10-CM

## 2022-09-08 LAB — CBC WITH DIFFERENTIAL/PLATELET
Abs Immature Granulocytes: 0.01 10*3/uL (ref 0.00–0.07)
Basophils Absolute: 0 10*3/uL (ref 0.0–0.1)
Basophils Relative: 0 %
Eosinophils Absolute: 0.1 10*3/uL (ref 0.0–0.5)
Eosinophils Relative: 1 %
HCT: 45 % (ref 36.0–46.0)
Hemoglobin: 14 g/dL (ref 12.0–15.0)
Immature Granulocytes: 0 %
Lymphocytes Relative: 10 %
Lymphs Abs: 0.6 10*3/uL — ABNORMAL LOW (ref 0.7–4.0)
MCH: 29 pg (ref 26.0–34.0)
MCHC: 31.1 g/dL (ref 30.0–36.0)
MCV: 93.2 fL (ref 80.0–100.0)
Monocytes Absolute: 0.3 10*3/uL (ref 0.1–1.0)
Monocytes Relative: 5 %
Neutro Abs: 5.4 10*3/uL (ref 1.7–7.7)
Neutrophils Relative %: 84 %
Platelets: 174 10*3/uL (ref 150–400)
RBC: 4.83 MIL/uL (ref 3.87–5.11)
RDW: 13.8 % (ref 11.5–15.5)
WBC: 6.5 10*3/uL (ref 4.0–10.5)
nRBC: 0 % (ref 0.0–0.2)

## 2022-09-08 LAB — URINALYSIS, ROUTINE W REFLEX MICROSCOPIC
Bacteria, UA: NONE SEEN
Bilirubin Urine: NEGATIVE
Glucose, UA: NEGATIVE mg/dL
Ketones, ur: NEGATIVE mg/dL
Nitrite: NEGATIVE
Protein, ur: 100 mg/dL — AB
RBC / HPF: >50 RBC/hpf (ref 0–5)
Specific Gravity, Urine: 1.012 (ref 1.005–1.030)
Squamous Epithelial / HPF: NONE SEEN /HPF (ref 0–5)
WBC, UA: >50 WBC/hpf (ref 0–5)
pH: 6 (ref 5.0–8.0)

## 2022-09-08 LAB — BASIC METABOLIC PANEL
Anion gap: 10 (ref 5–15)
BUN: 14 mg/dL (ref 8–23)
CO2: 26 mmol/L (ref 22–32)
Calcium: 9.4 mg/dL (ref 8.9–10.3)
Chloride: 105 mmol/L (ref 98–111)
Creatinine, Ser: 0.97 mg/dL (ref 0.44–1.00)
GFR, Estimated: >60 mL/min (ref >60)
Glucose, Bld: 116 mg/dL — ABNORMAL HIGH (ref 70–99)
Potassium: 4.3 mmol/L (ref 3.5–5.1)
Sodium: 141 mmol/L (ref 135–145)

## 2022-09-08 MED ORDER — COLCHICINE 0.6 MG PO TABS: 0.6000 mg | ORAL_TABLET | Freq: Two times a day (BID) | ORAL | 0 refills | Status: DC

## 2022-09-08 MED ORDER — PREDNISONE 20 MG PO TABS: 40.0000 mg | ORAL_TABLET | Freq: Every day | ORAL | 0 refills | Status: AC

## 2022-09-08 MED ORDER — TAMSULOSIN HCL 0.4 MG PO CAPS: 0.4000 mg | ORAL_CAPSULE | Freq: Every day | ORAL | 0 refills | Status: AC

## 2022-09-08 MED ORDER — PREDNISONE 20 MG PO TABS
40.0000 mg | ORAL_TABLET | Freq: Once | ORAL | Status: AC
  Administered 2022-09-08: 40 mg via ORAL
  Filled 2022-09-08: qty 2

## 2022-09-08 MED ORDER — COLCHICINE 0.6 MG PO TABS
1.2000 mg | ORAL_TABLET | Freq: Once | ORAL | Status: AC
  Administered 2022-09-08: 1.2 mg via ORAL
  Filled 2022-09-08: qty 2

## 2022-09-08 NOTE — ED Provider Notes (Signed)
[facility] Provider Note    Event Date/Time   First MD Initiated Contact with Patient 09/08/22 1048     (approximate)   History   Hematuria and Foot Pain   HPI  Tasha Wilson is a 77 y.o. female with history of high cholesterol, anemia, and cervical cancer status post chemo/radiation who presents with hematuria since last night, acute onset, described as initially somewhat dark and now with red blood when she urinates.  It is a small quantity.  She denies any dysuria, frequency, lower abdominal pain, flank pain, or fever.  The patient also reports left foot pain similar to prior gout flares.  She denies any trauma.  I reviewed the past medical records.  The patient's most recent outpatient encounter was on 2/12 with neurology.  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  She is on Seroquel.  She also has a history of venous sinus thrombosis.   Physical Exam   Triage Vital Signs: ED Triage Vitals  Enc Vitals Group     BP 09/08/22 0958 119/82     Pulse Rate 09/08/22 0954 85     Resp 09/08/22 0954 16     Temp 09/08/22 0954 98 F (36.7 C)     Temp Source 09/08/22 0954 Oral     SpO2 09/08/22 0954 98 %     Weight --      Height --      Head Circumference --      Peak Flow --      Pain Score 09/08/22 0957 8     Pain Loc --      Pain Edu? --      Excl. in [place]? --     Most recent vital signs: Vitals:   09/08/22 0958 09/08/22 1235  BP: 119/82 126/89  Pulse:  92  Resp:  18  Temp:    SpO2:  100%     General: Awake, no distress.  CV:  Good peripheral perfusion.  Resp:  Normal effort.  Abd:  Soft and nontender.  No distention.  Other:  Left first MTP joint with erythema and swelling.  Full range of motion.  2+ DP pulse.  Normal cap refill.  Motor and sensory intact.  No erythema, induration, or streaking going up the foot or leg.   ED Results / Procedures / Treatments   Labs (all  labs ordered are listed, but only abnormal results are displayed) Labs Reviewed  URINALYSIS, ROUTINE W REFLEX MICROSCOPIC - Abnormal; Notable for the following components:      Result Value   Color, Urine RED (*)    APPearance CLOUDY (*)    Hgb urine dipstick LARGE (*)    Protein, ur 100 (*)    Leukocytes,Ua TRACE (*)    All other components within normal limits  BASIC METABOLIC PANEL - Abnormal; Notable for the following components:   Glucose, Bld 116 (*)    All other components within normal limits  CBC WITH DIFFERENTIAL/PLATELET - Abnormal; Notable for the following components:   Lymphs Abs 0.6 (*)    All other components within normal limits     EKG    RADIOLOGY  CT renal stone study: 6 mm left UVJ stone with hydronephrosis.  Radiology report indicates marked hydroureteronephrosis.  PROCEDURES:  Critical Care performed: No  Procedures   MEDICATIONS ORDERED IN ED: Medications  colchicine tablet 1.2 mg (1.2 mg Oral Given 09/08/22 1234)  predniSONE (  DELTASONE) tablet 40 mg (40 mg Oral Given 09/08/22 1234)     IMPRESSION / MDM / ASSESSMENT AND PLAN / ED COURSE  I reviewed the triage vital signs and the nursing notes.  77 year old female with PMH as noted above presents with small-volume hematuria as well as atraumatic left foot pain.  Differential diagnosis includes, but is not limited to:  Hematuria: Most likely UTI/cystitis versus less likely ureteral stones or other urologic etiology.  There is no evidence of significant blood loss.  The vital signs are normal and the abdomen is nontender.  We will obtain a urinalysis.  Foot pain: This is consistent with a gout flare which the patient has had previously.  We will treat with colchicine and prednisone.  Patient's presentation is most consistent with acute complicated illness / injury requiring diagnostic workup.  ----------------------------------------- 3:49 PM on  09/08/2022 -----------------------------------------  Urinalysis shows WBCs and RBCs but no nitrates or bacteria.  There is no evidence of UTI.  We obtained a CT which shows a large left UVJ ureteral stone.  We then obtain basic labs.  The creatinine is normal.  I consulted Dr. Bernardo Heater from urology and discussed the case with him.  He advises that since the patient is not having pain, as long as the creatinine is normal she is appropriate for outpatient follow-up.  He recommends starting her on tamsulosin.  I have also prescribed prednisone and colchicine for the gout.  I counseled the patient and her family member on the results of the workup, urology recommendations, and the plan of care.  I gave strict return precautions and they expressed understanding.  FINAL CLINICAL IMPRESSION(S) / ED DIAGNOSES   Final diagnoses:  Ureteral stone  Hematuria, unspecified type     Rx / DC Orders   ED Discharge Orders          Ordered    predniSONE (DELTASONE) 20 MG tablet  Daily with breakfast        09/08/22 1547    tamsulosin (FLOMAX) 0.4 MG CAPS capsule  Daily after supper        09/08/22 1547    colchicine 0.6 MG tablet  2 times daily        09/08/22 1547             Note:  This document was prepared using Dragon voice recognition software and may include unintentional dictation errors.    Arta Silence, MD 09/08/22 1550

## 2022-09-08 NOTE — ED Triage Notes (Signed)
Pt to ED via POV with family for blood in her urine since last night as well as pain in her left foot. Pt states that she has been having pain her in her since yesterday morning and the blood in her urine started yesterday as well. Pt states that she is not having pain with urination. Pt is currently in NAD.

## 2022-09-08 NOTE — Discharge Instructions (Signed)
Take the prednisone and colchicine as prescribed for the gout pain.  Take the tamsulosin nightly to help the stone pass until you follow-up with urology.  Return to the ER for new, worsening, or persistent severe blood in the urine, abdominal or flank pain, vomiting, fever, weakness, or any other new or worsening symptoms that concern you.

## 2022-09-08 NOTE — ED Notes (Signed)
This RN attempted twice for blood stick. Called another nurse to try.

## 2022-09-08 NOTE — ED Notes (Signed)
Lab called to draw blood

## 2022-09-08 NOTE — ED Notes (Signed)
This Rn attempted IV twice with no success.

## 2022-09-21 DIAGNOSIS — N201 Calculus of ureter: Secondary | ICD-10-CM

## 2022-09-21 HISTORY — DX: Calculus of ureter: N20.1

## 2022-10-12 ENCOUNTER — Encounter: Payer: Self-pay | Admitting: Urology

## 2022-10-12 ENCOUNTER — Ambulatory Visit
Admission: RE | Admit: 2022-10-12 | Discharge: 2022-10-12 | Disposition: A | Discharge status: Home / Self Care | Payer: Medicare Other | Source: Ambulatory Visit | Attending: Urology | Admitting: Urology

## 2022-10-12 ENCOUNTER — Ambulatory Visit (INDEPENDENT_AMBULATORY_CARE_PROVIDER_SITE_OTHER): Payer: Medicare Other | Admitting: Urology

## 2022-10-12 VITALS — BP 140/82 | HR 74 | Ht 64.0 in | Wt 243.0 lb

## 2022-10-12 DIAGNOSIS — N200 Calculus of kidney: Secondary | ICD-10-CM

## 2022-10-12 DIAGNOSIS — N202 Calculus of kidney with calculus of ureter: Secondary | ICD-10-CM | POA: Diagnosis not present

## 2022-10-12 DIAGNOSIS — N201 Calculus of ureter: Secondary | ICD-10-CM

## 2022-10-12 DIAGNOSIS — N132 Hydronephrosis with renal and ureteral calculous obstruction: Secondary | ICD-10-CM

## 2022-10-12 LAB — MICROSCOPIC EXAMINATION: RBC, Urine: >30 /hpf — AB (ref 0–2)

## 2022-10-12 LAB — URINALYSIS, COMPLETE
Bilirubin, UA: NEGATIVE
Glucose, UA: NEGATIVE
Ketones, UA: NEGATIVE
Nitrite, UA: NEGATIVE
Specific Gravity, UA: 1.02 (ref 1.005–1.030)
Urobilinogen, Ur: 0.2 mg/dL (ref 0.2–1.0)
pH, UA: 6 (ref 5.0–7.5)

## 2022-10-12 NOTE — H&P (View-Only) (Signed)
 I, Tasha Wilson,acting as a scribe for Tasha Dedic C Adanya Sosinski, MD.,have documented all relevant documentation on the behalf of Tasha Dorin C Niketa Turner, MD,as directed by  Tasha Batterman C Jessyka Austria, MD while in the presence of Tasha Khun C Harlynn Kimbell, MD.   10/12/22 9:57 AM   Asiana Buscemi 12/02/1945 [id number]  Referring provider: Aycock, Ngwe A, MD [address] [place],  [place] [address]  Chief Complaint  Patient presents with   Nephrolithiasis    HPI: Tasha Wilson is a 77 y.o. female who is referred for nephrolithiasis.  ED visit 09/08/22 for gross hematuria. Denied flank/abdominal/pelvic pain/dysuria/lower urinary tract symptoms. CT was performed which showed a 7mm left distal ureteral calculus with left hydronephrosis/hydroureter. She also had non-obstructing bilateral renal calculi. Denies flank, abdominal or pelvic pain  Denies any further episodes of gross hematuria since the ED visit and is not aware of having passed any stones in her urine.  No prior history of kidney stones Urinalysis today showed >30 RBC's and 6-10 WBC's   PMH: Past Medical History:  Diagnosis Date   Anemia    Cancer (HCC)    cervical- chemo/radiation   Deep vein thrombosis (DVT) of lower extremity (HCC) 07/31/14   Bilateral - [facility]   High cholesterol    Neck pain    s/p accident on 12/13/15.  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   [facility]   Wears dentures    partial upper and lower   Wears hearing aid    left    Surgical History: Past Surgical History:  Procedure Laterality Date   COLONOSCOPY WITH PROPOFOL N/A 12/30/2015   Procedure: COLONOSCOPY WITH PROPOFOL;  Surgeon: Darren Wohl, MD;  Location: [facility];  Service: Endoscopy;  Laterality: N/A;   HIATAL HERNIA REPAIR  07/02/14   [facility]   IVC FILTER PLACEMENT ([facility] HX)     [facility] - placed 08/02/14, removed 09/28/14   NISSEN FUNDOPLICATION  07/02/14   [facility]   REPLACEMENT TOTAL KNEE Left     Home  Medications:  Allergies as of 10/12/2022   No Known Allergies      Medication List        Accurate as of October 12, 2022  9:57 AM. If you have any questions, ask your nurse or doctor.          acetaminophen 325 MG tablet Commonly known as: TYLENOL Take by mouth.   colchicine 0.6 MG tablet Take 1 tablet (0.6 mg total) by mouth 2 (two) times daily for 7 days.   Eliquis 5 MG Tabs tablet Generic drug: apixaban   levETIRAcetam 750 MG tablet Commonly known as: KEPPRA   lovastatin 20 MG tablet Commonly known as: MEVACOR Take 20 mg by mouth daily.   Vitamin D3 25 MCG (1000 UT) capsule Generic drug: Cholecalciferol Take by mouth.        Allergies: No Known Allergies  Family History: Family History  Problem Relation Age of Onset   Breast cancer Neg Hx     Social History:  reports that she has never smoked. She has never used smokeless tobacco. She reports that she does not drink alcohol and does not use drugs.   Physical Exam: BP (!) 140/82   Pulse 74   Ht 5' 4" (1.626 m)   Wt 243 lb (110.2 kg)   BMI 41.71 kg/m   Constitutional:  Alert and oriented, No acute distress. HEENT:  AT, moist mucus   membranes.  Trachea midline, no masses. Cardiovascular: No clubbing, cyanosis, or edema. Respiratory: Normal respiratory effort, no increased work of breathing. GI: Abdomen is soft, nontender, nondistended, no abdominal masses Skin: No rashes, bruises or suspicious lesions. Neurologic: Grossly intact, no focal deficits, moving all 4 extremities. Psychiatric: Normal mood and affect.  Laboratory Data: Lab Results  Component Value Date   WBC 6.5 09/08/2022   HGB 14.0 09/08/2022   HCT 45.0 09/08/2022   MCV 93.2 09/08/2022   PLT 174 09/08/2022    Lab Results  Component Value Date   CREATININE 0.97 09/08/2022    Urinalysis    Component Value Date/Time   COLORURINE RED (A) 09/08/2022 1000   APPEARANCEUR CLOUDY (A) 09/08/2022 1000   LABSPEC 1.012 09/08/2022  1000   PHURINE 6.0 09/08/2022 1000   GLUCOSEU NEGATIVE 09/08/2022 1000   HGBUR LARGE (A) 09/08/2022 1000   BILIRUBINUR NEGATIVE 09/08/2022 1000   KETONESUR NEGATIVE 09/08/2022 1000   PROTEINUR 100 (A) 09/08/2022 1000   NITRITE NEGATIVE 09/08/2022 1000   LEUKOCYTESUR TRACE (A) 09/08/2022 1000    Lab Results  Component Value Date   BACTERIA NONE SEEN 09/08/2022    Pertinent Imaging: CT scan was personally reviewed and interpreted  Results for orders placed during the hospital encounter of 09/08/22  CT Renal Stone Study  Narrative CLINICAL DATA:  Left flank pain and hematuria since yesterday.  EXAM: CT ABDOMEN AND PELVIS WITHOUT CONTRAST  TECHNIQUE: Multidetector CT imaging of the abdomen and pelvis was performed following the standard protocol without IV contrast.  RADIATION DOSE REDUCTION: This exam was performed according to the departmental dose-optimization program which includes automated exposure control, adjustment of the mA and/or kV according to patient size and/or use of iterative reconstruction technique.  COMPARISON:  None Available.  FINDINGS: Lower chest: No acute pulmonary findings. Right lower lobe bronchiectasis is noted. No pleural effusions or pulmonary lesions. The heart is normal in size. No pericardial effusion. Scattered vascular calcifications. Calcifications noted around mitral valve annulus. Small hiatal hernia.  Hepatobiliary: No hepatic lesions or intrahepatic biliary dilatation. The gallbladder is surgically absent. No common bile duct dilatation.  Pancreas: Prominent fatty interstices but mass, inflammation or ductal dilatation.  Spleen: Normal size.  No focal lesions.  Adrenals/Urinary Tract: Adrenal glands are normal.  Bilateral renal calculi are noted. There is marked left-sided hydroureteronephrosis down to an obstructing 7 x 6 mm left ureteral calculus near the UVJ.  No right-sided ureteral calculi. No bladder calculi. No  worrisome renal or bladder lesions without contrast.  Stomach/Bowel: The stomach, duodenum, small bowel and colon are grossly normal. No acute inflammatory process, mass lesions or obstructive findings. The terminal ileum and appendix are normal.  Vascular/Lymphatic: Moderate atherosclerotic calcifications involving the aorta and iliac arteries. No aneurysm. No mesenteric or retroperitoneal lymphadenopathy.  Reproductive: The uterus and ovaries are unremarkable.  Other: No pelvic mass or adenopathy. No free pelvic fluid collections. No inguinal mass or adenopathy. No abdominal wall hernia or subcutaneous lesions.  Musculoskeletal: No significant bony findings. Degenerative changes involving the lower lumbar spine and both hips.  IMPRESSION: 1. 7 x 6 mm obstructing distal left ureteral calculus near the UVJ with marked left-sided hydroureteronephrosis. 2. Bilateral renal calculi. 3. No worrisome renal or bladder lesions without contrast. 4. Status post cholecystectomy. No biliary dilatation. 5. Aortic atherosclerosis.  Aortic Atherosclerosis (ICD10-I70.0).   Electronically Signed By: P.  Gallerani M.D. On: 09/08/2022 13:02   Assessment & Plan:    Left distal ureteral calculus Still with microhematuria and   stone most likely present KUB ordered and will contact with results We discussed various treatment options for urolithiasis including observation with or without medical expulsive therapy, shockwave lithotripsy (SWL), ureteroscopy and laser lithotripsy with stent placement, and percutaneous nephrolithotomy. We discussed that management is based on stone size, location, density, patient co-morbidities, and patient preference.  Stones <5mm in size have a >80% spontaneous passage rate. Data surrounding the use of tamsulosin for medical expulsive therapy is controversial, but meta analyses suggests it is most efficacious for distal stones between 5-10mm in size. Possible side  effects include dizziness/lightheadedness, and retrograde ejaculation. SWL has a lower stone free rate in a single procedure, but also a lower complication rate compared to ureteroscopy and avoids a stent and associated stent related symptoms. Possible complications include renal hematoma, steinstrasse, and need for additional treatment. Ureteroscopy with laser lithotripsy and stent placement has a higher stone free rate than SWL in a single procedure, however increased complication rate including possible infection, ureteral injury, bleeding, and stent related morbidity. Common stent related symptoms include dysuria, urgency/frequency, and flank pain. PCNL is the favored treatment for stones >2cm. It involves a small incision in the flank, with complete fragmentation of stones and removal. It has the highest stone free rate, but also the highest complication rate. Possible complications include bleeding, infection/sepsis, injury to surrounding organs including the pleura, and collecting system injury.  After an extensive discussion of the risks and benefits of the above treatment options, the patient would like to proceed with KUB.   2. Bilateral nephrolithiasis Bilateral non-obstructing renal calculi  [facility] [address], [address] [place], [place] [address] [phone]  

## 2022-10-12 NOTE — Progress Notes (Signed)
I, Tasha Wilson,acting as a scribe for Tasha Altes, MD.,have documented all relevant documentation on the behalf of Tasha Altes, MD,as directed by  Tasha Altes, MD while in the presence of Tasha Altes, MD.   10/12/22 9:57 AM   Tasha Wilson 1946/06/25 [id number]  Referring provider: Emogene Morgan, MD [address] [place],  [place] [address]  Chief Complaint  Patient presents with   Nephrolithiasis    HPI: Tasha Wilson is a 77 y.o. female who is referred for nephrolithiasis.  ED visit 09/08/22 for gross hematuria. Denied flank/abdominal/pelvic pain/dysuria/lower urinary tract symptoms. CT was performed which showed a 7mm left distal ureteral calculus with left hydronephrosis/hydroureter. She also had non-obstructing bilateral renal calculi. Denies flank, abdominal or pelvic pain  Denies any further episodes of gross hematuria since the ED visit and is not aware of having passed any stones in her urine.  No prior history of kidney stones Urinalysis today showed >30 RBC's and 6-10 WBC's   PMH: Past Medical History:  Diagnosis Date   Anemia    Cancer (HCC)    cervical- chemo/radiation   Deep vein thrombosis (DVT) of lower extremity (HCC) 07/31/14   Bilateral - [facility]   High cholesterol    Neck pain    s/p accident on 12/13/15.  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   [facility]   Wears dentures    partial upper and lower   Wears hearing aid    left    Surgical History: Past Surgical History:  Procedure Laterality Date   COLONOSCOPY WITH PROPOFOL N/A 12/30/2015   Procedure: COLONOSCOPY WITH PROPOFOL;  Surgeon: Midge Minium, MD;  Location: [facility];  Service: Endoscopy;  Laterality: N/A;   HIATAL HERNIA REPAIR  07/02/14   [facility]   IVC FILTER PLACEMENT ([facility] HX)     [facility] - placed 08/02/14, removed 09/28/14   NISSEN FUNDOPLICATION  07/02/14   [facility]   REPLACEMENT TOTAL KNEE Left     Home  Medications:  Allergies as of 10/12/2022   No Known Allergies      Medication List        Accurate as of October 12, 2022  9:57 AM. If you have any questions, ask your nurse or doctor.          acetaminophen 325 MG tablet Commonly known as: TYLENOL Take by mouth.   colchicine 0.6 MG tablet Take 1 tablet (0.6 mg total) by mouth 2 (two) times daily for 7 days.   Eliquis 5 MG Tabs tablet Generic drug: apixaban   levETIRAcetam 750 MG tablet Commonly known as: KEPPRA   lovastatin 20 MG tablet Commonly known as: MEVACOR Take 20 mg by mouth daily.   Vitamin D3 25 MCG (1000 UT) capsule Generic drug: Cholecalciferol Take by mouth.        Allergies: No Known Allergies  Family History: Family History  Problem Relation Age of Onset   Breast cancer Neg Hx     Social History:  reports that she has never smoked. She has never used smokeless tobacco. She reports that she does not drink alcohol and does not use drugs.   Physical Exam: BP (!) 140/82   Pulse 74   Ht 5\' 4"  (1.626 m)   Wt 243 lb (110.2 kg)   BMI 41.71 kg/m   Constitutional:  Alert and oriented, No acute distress. HEENT: [place] AT, moist mucus  membranes.  Trachea midline, no masses. Cardiovascular: No clubbing, cyanosis, or edema. Respiratory: Normal respiratory effort, no increased work of breathing. GI: Abdomen is soft, nontender, nondistended, no abdominal masses Skin: No rashes, bruises or suspicious lesions. Neurologic: Grossly intact, no focal deficits, moving all 4 extremities. Psychiatric: Normal mood and affect.  Laboratory Data: Lab Results  Component Value Date   WBC 6.5 09/08/2022   HGB 14.0 09/08/2022   HCT 45.0 09/08/2022   MCV 93.2 09/08/2022   PLT 174 09/08/2022    Lab Results  Component Value Date   CREATININE 0.97 09/08/2022    Urinalysis    Component Value Date/Time   COLORURINE RED (A) 09/08/2022 1000   APPEARANCEUR CLOUDY (A) 09/08/2022 1000   LABSPEC 1.012 09/08/2022  1000   PHURINE 6.0 09/08/2022 1000   GLUCOSEU NEGATIVE 09/08/2022 1000   HGBUR LARGE (A) 09/08/2022 1000   BILIRUBINUR NEGATIVE 09/08/2022 1000   KETONESUR NEGATIVE 09/08/2022 1000   PROTEINUR 100 (A) 09/08/2022 1000   NITRITE NEGATIVE 09/08/2022 1000   LEUKOCYTESUR TRACE (A) 09/08/2022 1000    Lab Results  Component Value Date   BACTERIA NONE SEEN 09/08/2022    Pertinent Imaging: CT scan was personally reviewed and interpreted  Results for orders placed during the hospital encounter of 09/08/22  CT Renal Stone Study  Narrative CLINICAL DATA:  Left flank pain and hematuria since yesterday.  EXAM: CT ABDOMEN AND PELVIS WITHOUT CONTRAST  TECHNIQUE: Multidetector CT imaging of the abdomen and pelvis was performed following the standard protocol without IV contrast.  RADIATION DOSE REDUCTION: This exam was performed according to the departmental dose-optimization program which includes automated exposure control, adjustment of the mA and/or kV according to patient size and/or use of iterative reconstruction technique.  COMPARISON:  None Available.  FINDINGS: Lower chest: No acute pulmonary findings. Right lower lobe bronchiectasis is noted. No pleural effusions or pulmonary lesions. The heart is normal in size. No pericardial effusion. Scattered vascular calcifications. Calcifications noted around mitral valve annulus. Small hiatal hernia.  Hepatobiliary: No hepatic lesions or intrahepatic biliary dilatation. The gallbladder is surgically absent. No common bile duct dilatation.  Pancreas: Prominent fatty interstices but mass, inflammation or ductal dilatation.  Spleen: Normal size.  No focal lesions.  Adrenals/Urinary Tract: Adrenal glands are normal.  Bilateral renal calculi are noted. There is marked left-sided hydroureteronephrosis down to an obstructing 7 x 6 mm left ureteral calculus near the UVJ.  No right-sided ureteral calculi. No bladder calculi. No  worrisome renal or bladder lesions without contrast.  Stomach/Bowel: The stomach, duodenum, small bowel and colon are grossly normal. No acute inflammatory process, mass lesions or obstructive findings. The terminal ileum and appendix are normal.  Vascular/Lymphatic: Moderate atherosclerotic calcifications involving the aorta and iliac arteries. No aneurysm. No mesenteric or retroperitoneal lymphadenopathy.  Reproductive: The uterus and ovaries are unremarkable.  Other: No pelvic mass or adenopathy. No free pelvic fluid collections. No inguinal mass or adenopathy. No abdominal wall hernia or subcutaneous lesions.  Musculoskeletal: No significant bony findings. Degenerative changes involving the lower lumbar spine and both hips.  IMPRESSION: 1. 7 x 6 mm obstructing distal left ureteral calculus near the UVJ with marked left-sided hydroureteronephrosis. 2. Bilateral renal calculi. 3. No worrisome renal or bladder lesions without contrast. 4. Status post cholecystectomy. No biliary dilatation. 5. Aortic atherosclerosis.  Aortic Atherosclerosis (ICD10-I70.0).   Electronically Signed By: Rudie Meyer M.D. On: 09/08/2022 13:02   Assessment & Plan:    Left distal ureteral calculus Still with microhematuria and  stone most likely present KUB ordered and will contact with results We discussed various treatment options for urolithiasis including observation with or without medical expulsive therapy, shockwave lithotripsy (SWL), ureteroscopy and laser lithotripsy with stent placement, and percutaneous nephrolithotomy. We discussed that management is based on stone size, location, density, patient co-morbidities, and patient preference.  Stones <7mm in size have a >80% spontaneous passage rate. Data surrounding the use of tamsulosin for medical expulsive therapy is controversial, but meta analyses suggests it is most efficacious for distal stones between 5-10mm in size. Possible side  effects include dizziness/lightheadedness, and retrograde ejaculation. SWL has a lower stone free rate in a single procedure, but also a lower complication rate compared to ureteroscopy and avoids a stent and associated stent related symptoms. Possible complications include renal hematoma, steinstrasse, and need for additional treatment. Ureteroscopy with laser lithotripsy and stent placement has a higher stone free rate than SWL in a single procedure, however increased complication rate including possible infection, ureteral injury, bleeding, and stent related morbidity. Common stent related symptoms include dysuria, urgency/frequency, and flank pain. PCNL is the favored treatment for stones >2cm. It involves a small incision in the flank, with complete fragmentation of stones and removal. It has the highest stone free rate, but also the highest complication rate. Possible complications include bleeding, infection/sepsis, injury to surrounding organs including the pleura, and collecting system injury.  After an extensive discussion of the risks and benefits of the above treatment options, the patient would like to proceed with KUB.   2. Bilateral nephrolithiasis Bilateral non-obstructing renal calculi  [facility] [address] [place], [place] [address] [phone]

## 2022-10-14 ENCOUNTER — Encounter: Payer: Self-pay | Admitting: Urology

## 2022-10-16 ENCOUNTER — Telehealth: Payer: Self-pay | Admitting: Urology

## 2022-10-16 NOTE — Telephone Encounter (Signed)
Notified patient as instructed, patient states she would like ureteroscopy

## 2022-10-16 NOTE — Telephone Encounter (Signed)
KUB reviewed and stone is still present.  Recommend scheduling ureteroscopy/stone removal.  If she does not have any questions I will send orders to [facility]

## 2022-10-17 ENCOUNTER — Telehealth: Payer: Self-pay

## 2022-10-17 ENCOUNTER — Other Ambulatory Visit: Payer: Self-pay | Admitting: Urology

## 2022-10-17 DIAGNOSIS — N201 Calculus of ureter: Secondary | ICD-10-CM

## 2022-10-17 NOTE — Progress Notes (Signed)
   [facility]  Surgery Date: Date: 10/30/2022  Surgeon: Dr. John Giovanni, MD  Inpt ( No  )   Outpt (Yes)   Obs ( No  )   Diagnosis: N20.1 Left Ureteral Stone  -CPT: [phone]  Surgery: Left Ureteroscopy with Laser Lithotripsy and Stent Placement   Stop Anticoagulations: Yes, will need to hold Eliquis  Cardiac/Medical/Pulmonary Clearance needed: yes  Clearance needed from Dr: Clide Deutscher  Clearance request sent on: Date: 10/17/22  *Orders entered into EPIC  Date: 10/17/22   *Case booked in EPIC  Date: 10/17/22  *Notified pt of Surgery: Date: 10/17/22  PRE-OP UA & CX: yes, will obtain in clinic on 10/22/2022  *Placed into Prior Authorization Work Fabio Bering Date: 10/17/22  Assistant/laser/rep:No

## 2022-10-17 NOTE — Progress Notes (Signed)
  Phone Number: [phone] for Surgical Coordinator Fax Number: [phone]  REQUEST FOR SURGICAL CLEARANCE       Date: Date: 10/30/2022  Faxed to: Dr. Clide Deutscher, MD  Surgeon: Dr. John Giovanni, MD     Date of Surgery: 10/30/2022  Operation:  Left Ureteroscopy with Laser Lithotripsy and Stent Placement   Anesthesia Type: General   Diagnosis: Left Ureteral Stone  Patient Requires:   Medical Clearance : Yes  Reason: Will need patient to hold Eliquis for 48 hours prior to surgery.    Risk Assessment:    Low   []       Moderate   []     High   []           This patient is optimized for surgery  YES []       NO   []    I recommend further assessment/workup prior to surgery. YES []      NO  []   Appointment scheduled for: _______________________   Further recommendations: ____________________________________     Physician Signature:__________________________________   Printed Name: ________________________________________   Date: _________________

## 2022-10-17 NOTE — Telephone Encounter (Signed)
I spoke with Mrs. Core- Daughter of Patient, who is POA. We have discussed possible surgery dates and Tuesday April 9th, 2024 was agreed upon by all parties. Patient given information about surgery date, what to expect pre-operatively and post operatively.  We discussed that a Pre-Admission Testing office will be calling to set up the pre-op visit that will take place prior to surgery, and that these appointments are typically done over the phone with a Pre-Admissions RN. Informed patient that our office will communicate any additional care to be provided after surgery. Patients questions or concerns were discussed during our call. Advised to call our office should there be any additional information, questions or concerns that arise. Patient verbalized understanding.

## 2022-10-17 NOTE — Progress Notes (Signed)
Surgical Physician Order Form [facility]  * Scheduling expectation :  Patient preference  *Length of Case: 45 minutes  *Clearance needed: yes; on Eliquis  *Anticoagulation Instructions: Hold all anticoagulants  *Aspirin Instructions: N/A  *Post-op visit Date/Instructions:  1 week cysto stent removal  *Diagnosis: Left Ureteral Stone  *Procedure: Left Ureteroscopy w/laser lithotripsy & stent placement [id number])   Additional orders: N/A  -Admit type: OUTpatient  -Anesthesia: Choice  -VTE Prophylaxis Standing Order SCD's       Other:   -Standing Lab Orders Per Anesthesia    Lab other: UA&Urine Culture  -Standing Test orders EKG/Chest x-ray per Anesthesia       Test other:   - Medications: Ancef 3 g IV  -Other orders:  N/A

## 2022-10-22 ENCOUNTER — Other Ambulatory Visit: Payer: Medicare Other

## 2022-10-22 ENCOUNTER — Encounter
Admission: RE | Admit: 2022-10-22 | Discharge: 2022-10-22 | Disposition: A | Discharge status: Home / Self Care | Payer: Medicare Other | Source: Ambulatory Visit | Attending: Urology | Admitting: Urology

## 2022-10-22 VITALS — Ht 64.0 in | Wt 243.0 lb

## 2022-10-22 DIAGNOSIS — I609 Nontraumatic subarachnoid hemorrhage, unspecified: Secondary | ICD-10-CM

## 2022-10-22 DIAGNOSIS — N201 Calculus of ureter: Secondary | ICD-10-CM

## 2022-10-22 DIAGNOSIS — Z01812 Encounter for preprocedural laboratory examination: Secondary | ICD-10-CM

## 2022-10-22 HISTORY — DX: Pneumonia, unspecified organism: J18.9

## 2022-10-22 HISTORY — DX: Gout, unspecified: M10.9

## 2022-10-22 HISTORY — DX: Cerebral infarction, unspecified: I63.9

## 2022-10-22 HISTORY — DX: Personal history of urinary calculi: Z87.442

## 2022-10-22 HISTORY — DX: Hyperlipidemia, unspecified: E78.5

## 2022-10-22 HISTORY — DX: Nontraumatic subarachnoid hemorrhage, unspecified: I60.9

## 2022-10-22 HISTORY — DX: Nausea with vomiting, unspecified: R11.2

## 2022-10-22 HISTORY — DX: Other specified postprocedural states: Z98.890

## 2022-10-22 HISTORY — DX: Personal history of other diseases of the digestive system: Z87.19

## 2022-10-22 LAB — URINALYSIS, COMPLETE
Bilirubin, UA: NEGATIVE
Glucose, UA: NEGATIVE
Ketones, UA: NEGATIVE
Nitrite, UA: NEGATIVE
Specific Gravity, UA: 1.025 (ref 1.005–1.030)
Urobilinogen, Ur: 0.2 mg/dL (ref 0.2–1.0)
pH, UA: 5.5 (ref 5.0–7.5)

## 2022-10-22 LAB — MICROSCOPIC EXAMINATION: RBC, Urine: >30 /hpf — AB (ref 0–2)

## 2022-10-22 NOTE — Patient Instructions (Addendum)
Your procedure is scheduled on: Tuesday, April 9 Report to the Registration Desk on the 1st floor of the [facility]. To find out your arrival time, please call [phone] between 1PM - 3PM on: Monday, April 8 If your arrival time is 6:00 am, do not arrive before that time as the [facility] entrance doors do not open until 6:00 am.  REMEMBER: Instructions that are not followed completely may result in serious medical risk, up to and including death; or upon the discretion of your surgeon and anesthesiologist your surgery may need to be rescheduled.  Do not eat or drink after midnight the night before surgery.  No gum chewing or hard candies.  One week prior to surgery: starting April 2 Stop Anti-inflammatories (NSAIDS) such as Advil, Aleve, Ibuprofen, Motrin, Naproxen, Naprosyn and Aspirin based products such as Excedrin, Goody's Powder, BC Powder. Stop ANY OVER THE COUNTER supplements until after surgery. Stop vitamin D and B. You may however, continue to take Tylenol if needed for pain up until the day of surgery.  Continue taking all prescribed medications with the exception of the following:  Per Dr. Erlene Quan hold Eliquis for 48 hours. The last day to take Eliquis is Saturday, April 6. Resume AFTER surgery per surgeon instruction.  TAKE ONLY THESE MEDICATIONS THE MORNING OF SURGERY WITH A SIP OF WATER:  Lamotrigine (Lamictal)  No Alcohol for 24 hours before or after surgery.  No Smoking including e-cigarettes for 24 hours before surgery.  No chewable tobacco products for at least 6 hours before surgery.  No nicotine patches on the day of surgery.  Do not use any "recreational" drugs for at least a week (preferably 2 weeks) before your surgery.  Please be advised that the combination of cocaine and anesthesia may have negative outcomes, up to and including death. If you test positive for cocaine, your surgery will be cancelled.  On the morning of surgery brush your teeth  with toothpaste and water, you may rinse your mouth with mouthwash if you wish. Do not swallow any toothpaste or mouthwash.  Do not wear jewelry, make-up, hairpins, clips or nail polish.  Do not wear lotions, powders, or perfumes.   Do not shave body hair from the neck down 48 hours before surgery.  Contact lenses, hearing aids and dentures may not be worn into surgery.  Do not bring valuables to the hospital. [facility] is not responsible for any missing/lost belongings or valuables.   Notify your doctor if there is any change in your medical condition (cold, fever, infection).  Wear comfortable clothing (specific to your surgery type) to the hospital.  After surgery, you can help prevent lung complications by doing breathing exercises.  Take deep breaths and cough every 1-2 hours. Your doctor may order a device called an Incentive Spirometer to help you take deep breaths.  If you are being discharged the day of surgery, you will not be allowed to drive home. You will need a responsible individual to drive you home and stay with you for 24 hours after surgery.   If you are taking public transportation, you will need to have a responsible individual with you.  Please call the [facility] Dept. at [phone] if you have any questions about these instructions.  Surgery Visitation Policy:  Patients having surgery or a procedure may have two visitors.  Children under the age of 43 must have an adult with them who is not the patient.

## 2022-10-23 ENCOUNTER — Encounter
Admission: RE | Admit: 2022-10-23 | Discharge: 2022-10-23 | Disposition: A | Discharge status: Home / Self Care | Payer: Medicare Other | Source: Ambulatory Visit | Attending: Urology | Admitting: Urology

## 2022-10-23 DIAGNOSIS — Z01810 Encounter for preprocedural cardiovascular examination: Secondary | ICD-10-CM | POA: Diagnosis not present

## 2022-10-23 DIAGNOSIS — I609 Nontraumatic subarachnoid hemorrhage, unspecified: Secondary | ICD-10-CM

## 2022-10-23 DIAGNOSIS — Z01812 Encounter for preprocedural laboratory examination: Secondary | ICD-10-CM

## 2022-10-23 DIAGNOSIS — E785 Hyperlipidemia, unspecified: Secondary | ICD-10-CM | POA: Insufficient documentation

## 2022-10-23 DIAGNOSIS — Z8673 Personal history of transient ischemic attack (TIA), and cerebral infarction without residual deficits: Secondary | ICD-10-CM | POA: Diagnosis not present

## 2022-10-25 LAB — CULTURE, URINE COMPREHENSIVE

## 2022-10-29 MED ORDER — LACTATED RINGERS IV SOLN: INTRAVENOUS | Status: DC

## 2022-10-29 MED ORDER — ORAL CARE MOUTH RINSE: 15.0000 mL | Freq: Once | OROMUCOSAL | Status: AC

## 2022-10-29 MED ORDER — CEFAZOLIN IN SODIUM CHLORIDE 3-0.9 GM/100ML-% IV SOLN
3.0000 g | INTRAVENOUS | Status: AC
  Administered 2022-10-30: 3 g via INTRAVENOUS
  Filled 2022-10-29: qty 100

## 2022-10-29 MED ORDER — FAMOTIDINE 20 MG PO TABS
20.0000 mg | ORAL_TABLET | Freq: Once | ORAL | Status: AC
  Administered 2022-10-30: 20 mg via ORAL

## 2022-10-29 MED ORDER — CHLORHEXIDINE GLUCONATE 0.12 % MT SOLN
15.0000 mL | Freq: Once | OROMUCOSAL | Status: AC
  Administered 2022-10-30: 15 mL via OROMUCOSAL

## 2022-10-30 ENCOUNTER — Ambulatory Visit: Payer: Medicare Other | Admitting: Anesthesiology

## 2022-10-30 ENCOUNTER — Ambulatory Visit: Payer: Medicare Other

## 2022-10-30 ENCOUNTER — Encounter: Payer: Self-pay | Admitting: Urology

## 2022-10-30 ENCOUNTER — Encounter
Admission: RE | Disposition: A | Discharge status: Home / Self Care | Payer: Self-pay | Source: Home / Self Care | Attending: Urology

## 2022-10-30 ENCOUNTER — Other Ambulatory Visit: Payer: Self-pay

## 2022-10-30 ENCOUNTER — Ambulatory Visit: Payer: Medicare Other | Admitting: Urgent Care

## 2022-10-30 ENCOUNTER — Ambulatory Visit
Admission: RE | Admit: 2022-10-30 | Discharge: 2022-10-30 | Disposition: A | Discharge status: Home / Self Care | Payer: Medicare Other | Attending: Urology | Admitting: Urology

## 2022-10-30 DIAGNOSIS — R310 Gross hematuria: Secondary | ICD-10-CM | POA: Diagnosis not present

## 2022-10-30 DIAGNOSIS — N201 Calculus of ureter: Secondary | ICD-10-CM | POA: Diagnosis not present

## 2022-10-30 DIAGNOSIS — N132 Hydronephrosis with renal and ureteral calculous obstruction: Secondary | ICD-10-CM | POA: Diagnosis present

## 2022-10-30 HISTORY — PX: CYSTOSCOPY/URETEROSCOPY/HOLMIUM LASER/STENT PLACEMENT: SHX6546

## 2022-10-30 SURGERY — CYSTOSCOPY/URETEROSCOPY/HOLMIUM LASER/STENT PLACEMENT
Anesthesia: General | Laterality: Left

## 2022-10-30 MED ORDER — ACETAMINOPHEN 10 MG/ML IV SOLN: 1000.0000 mg | Freq: Once | INTRAVENOUS | Status: DC | PRN

## 2022-10-30 MED ORDER — ONDANSETRON HCL 4 MG/2ML IJ SOLN
4.0000 mg | Freq: Once | INTRAMUSCULAR | Status: AC | PRN
  Administered 2022-10-30: 4 mg via INTRAVENOUS

## 2022-10-30 MED ORDER — OXYCODONE HCL 5 MG PO TABS: 5.0000 mg | ORAL_TABLET | Freq: Once | ORAL | Status: DC | PRN

## 2022-10-30 MED ORDER — MIDAZOLAM HCL 2 MG/2ML IJ SOLN
INTRAMUSCULAR | Status: AC
  Filled 2022-10-30: qty 2

## 2022-10-30 MED ORDER — EPHEDRINE SULFATE (PRESSORS) 50 MG/ML IJ SOLN
INTRAMUSCULAR | Status: DC | PRN
  Administered 2022-10-30: 5 mg via INTRAVENOUS
  Administered 2022-10-30 (×2): 7.5 mg via INTRAVENOUS
  Administered 2022-10-30: 5 mg via INTRAVENOUS

## 2022-10-30 MED ORDER — FENTANYL CITRATE (PF) 100 MCG/2ML IJ SOLN
INTRAMUSCULAR | Status: DC | PRN
  Administered 2022-10-30: 50 ug via INTRAVENOUS
  Administered 2022-10-30 (×2): 25 ug via INTRAVENOUS

## 2022-10-30 MED ORDER — PHENYLEPHRINE 80 MCG/ML (10ML) SYRINGE FOR IV PUSH (FOR BLOOD PRESSURE SUPPORT)
PREFILLED_SYRINGE | INTRAVENOUS | Status: AC
  Filled 2022-10-30: qty 10

## 2022-10-30 MED ORDER — IOHEXOL 180 MG/ML  SOLN
INTRAMUSCULAR | Status: DC | PRN
  Administered 2022-10-30 (×2): 10 mL

## 2022-10-30 MED ORDER — PHENYLEPHRINE HCL (PRESSORS) 10 MG/ML IV SOLN
INTRAVENOUS | Status: DC | PRN
  Administered 2022-10-30: 160 ug via INTRAVENOUS
  Administered 2022-10-30 (×4): 80 ug via INTRAVENOUS
  Administered 2022-10-30: 160 ug via INTRAVENOUS
  Administered 2022-10-30: 80 ug via INTRAVENOUS

## 2022-10-30 MED ORDER — LIDOCAINE HCL (CARDIAC) PF 100 MG/5ML IV SOSY
PREFILLED_SYRINGE | INTRAVENOUS | Status: DC | PRN
  Administered 2022-10-30: 100 mg via INTRAVENOUS

## 2022-10-30 MED ORDER — ONDANSETRON HCL 4 MG/2ML IJ SOLN
INTRAMUSCULAR | Status: AC
  Filled 2022-10-30: qty 2

## 2022-10-30 MED ORDER — CEFAZOLIN SODIUM-DEXTROSE 2-4 GM/100ML-% IV SOLN
INTRAVENOUS | Status: AC
  Filled 2022-10-30: qty 100

## 2022-10-30 MED ORDER — CHLORHEXIDINE GLUCONATE 0.12 % MT SOLN
OROMUCOSAL | Status: AC
  Filled 2022-10-30: qty 15

## 2022-10-30 MED ORDER — DEXAMETHASONE SODIUM PHOSPHATE 10 MG/ML IJ SOLN
INTRAMUSCULAR | Status: DC | PRN
  Administered 2022-10-30: 10 mg via INTRAVENOUS

## 2022-10-30 MED ORDER — MIDAZOLAM HCL 2 MG/2ML IJ SOLN
INTRAMUSCULAR | Status: DC | PRN
  Administered 2022-10-30: 1 mg via INTRAVENOUS

## 2022-10-30 MED ORDER — PROPOFOL 10 MG/ML IV BOLUS
INTRAVENOUS | Status: AC
  Filled 2022-10-30: qty 20

## 2022-10-30 MED ORDER — ACETAMINOPHEN 10 MG/ML IV SOLN
INTRAVENOUS | Status: DC | PRN
  Administered 2022-10-30: 1000 mg via INTRAVENOUS

## 2022-10-30 MED ORDER — SODIUM CHLORIDE 0.9 % IR SOLN
Status: DC | PRN
  Administered 2022-10-30: 2000 mL via INTRAVESICAL

## 2022-10-30 MED ORDER — FENTANYL CITRATE (PF) 100 MCG/2ML IJ SOLN: 25.0000 ug | INTRAMUSCULAR | Status: DC | PRN

## 2022-10-30 MED ORDER — DROPERIDOL 2.5 MG/ML IJ SOLN
0.6250 mg | Freq: Once | INTRAMUSCULAR | Status: AC
  Administered 2022-10-30: 0.625 mg via INTRAVENOUS

## 2022-10-30 MED ORDER — ONDANSETRON HCL 4 MG/2ML IJ SOLN
INTRAMUSCULAR | Status: DC | PRN
  Administered 2022-10-30: 4 mg via INTRAVENOUS

## 2022-10-30 MED ORDER — DROPERIDOL 2.5 MG/ML IJ SOLN
INTRAMUSCULAR | Status: AC
  Filled 2022-10-30: qty 2

## 2022-10-30 MED ORDER — OXYCODONE HCL 5 MG/5ML PO SOLN: 5.0000 mg | Freq: Once | ORAL | Status: DC | PRN

## 2022-10-30 MED ORDER — FENTANYL CITRATE (PF) 100 MCG/2ML IJ SOLN
INTRAMUSCULAR | Status: AC
  Filled 2022-10-30: qty 2

## 2022-10-30 MED ORDER — ACETAMINOPHEN 10 MG/ML IV SOLN
INTRAVENOUS | Status: AC
  Filled 2022-10-30: qty 100

## 2022-10-30 MED ORDER — FAMOTIDINE 20 MG PO TABS
ORAL_TABLET | ORAL | Status: AC
  Filled 2022-10-30: qty 1

## 2022-10-30 MED ORDER — LIDOCAINE HCL (PF) 2 % IJ SOLN
INTRAMUSCULAR | Status: AC
  Filled 2022-10-30: qty 5

## 2022-10-30 MED ORDER — PROPOFOL 10 MG/ML IV BOLUS
INTRAVENOUS | Status: DC | PRN
  Administered 2022-10-30: 20 mg via INTRAVENOUS
  Administered 2022-10-30: 120 mg via INTRAVENOUS

## 2022-10-30 MED ORDER — TROSPIUM CHLORIDE 20 MG PO TABS: 20.0000 mg | ORAL_TABLET | Freq: Two times a day (BID) | ORAL | 0 refills | Status: DC | PRN

## 2022-10-30 MED ORDER — DEXAMETHASONE SODIUM PHOSPHATE 10 MG/ML IJ SOLN
INTRAMUSCULAR | Status: AC
  Filled 2022-10-30: qty 1

## 2022-10-30 SURGICAL SUPPLY — 29 items
BAG DRAIN SIEMENS DORNER NS (MISCELLANEOUS) ×1 IMPLANT
BAG DRN NS LF (MISCELLANEOUS) ×1
BASKET ZERO TIP 1.9FR (BASKET) IMPLANT
BRUSH SCRUB EZ 1% IODOPHOR (MISCELLANEOUS) ×1 IMPLANT
BSKT STON RTRVL ZERO TP 1.9FR (BASKET)
CATH URET FLEX-TIP 2 LUMEN 10F (CATHETERS) IMPLANT
CATH URETL OPEN END 6X70 (CATHETERS) IMPLANT
CNTNR URN SCR LID CUP LEK RST (MISCELLANEOUS) IMPLANT
CONT SPEC 4OZ STRL OR WHT (MISCELLANEOUS)
DRAPE UTILITY 15X26 TOWEL STRL (DRAPES) ×1 IMPLANT
FIBER LASER MOSES 200 DFL (Laser) IMPLANT
GLOVE BIOGEL PI IND STRL 7.5 (GLOVE) ×1 IMPLANT
GOWN STRL REUS W/ TWL LRG LVL3 (GOWN DISPOSABLE) ×1 IMPLANT
GOWN STRL REUS W/ TWL XL LVL3 (GOWN DISPOSABLE) ×1 IMPLANT
GOWN STRL REUS W/TWL LRG LVL3 (GOWN DISPOSABLE) ×1
GOWN STRL REUS W/TWL XL LVL3 (GOWN DISPOSABLE) ×1
GUIDEWIRE STR DUAL SENSOR (WIRE) ×1 IMPLANT
IV NS IRRIG 3000ML ARTHROMATIC (IV SOLUTION) ×1 IMPLANT
KIT TURNOVER CYSTO (KITS) ×1 IMPLANT
PACK CYSTO AR (MISCELLANEOUS) ×1 IMPLANT
SET CYSTO W/LG BORE CLAMP LF (SET/KITS/TRAYS/PACK) ×1 IMPLANT
SHEATH NAVIGATOR HD 12/14X36 (SHEATH) IMPLANT
STENT URET 6FRX22 CONTOUR (STENTS) IMPLANT
STENT URET 6FRX24 CONTOUR (STENTS) IMPLANT
STENT URET 6FRX26 CONTOUR (STENTS) IMPLANT
SURGILUBE 2OZ TUBE FLIPTOP (MISCELLANEOUS) ×1 IMPLANT
TRAP FLUID SMOKE EVACUATOR (MISCELLANEOUS) ×1 IMPLANT
VALVE UROSEAL ADJ ENDO (VALVE) IMPLANT
WATER STERILE IRR 500ML POUR (IV SOLUTION) ×1 IMPLANT

## 2022-10-30 NOTE — Anesthesia Procedure Notes (Signed)
Procedure Name: LMA Insertion Date/Time: 10/30/2022 2:43 PM  Performed by: Meleana Commerford, [place], CRNAPre-anesthesia Checklist: Patient identified, Patient being monitored, Timeout performed, Emergency Drugs available and Suction available Patient Re-evaluated:Patient Re-evaluated prior to induction Oxygen Delivery Method: Circle system utilized Preoxygenation: Pre-oxygenation with 100% oxygen Induction Type: IV induction Ventilation: Mask ventilation without difficulty LMA: LMA inserted LMA Size: 4.0 Tube type: Oral Number of attempts: 1 Placement Confirmation: positive ETCO2 and breath sounds checked- equal and bilateral Tube secured with: Tape Dental Injury: Teeth and Oropharynx as per pre-operative assessment

## 2022-10-30 NOTE — Anesthesia Postprocedure Evaluation (Signed)
Anesthesia Post Note  Patient: Tasha Wilson  Procedure(s) Performed: CYSTOSCOPY/URETEROSCOPY/HOLMIUM LASER/STENT PLACEMENT (Left)  Patient location during evaluation: PACU Anesthesia Type: General Level of consciousness: awake and alert Pain management: pain level controlled Vital Signs Assessment: post-procedure vital signs reviewed and stable Respiratory status: spontaneous breathing, nonlabored ventilation, respiratory function stable and patient connected to nasal cannula oxygen Cardiovascular status: blood pressure returned to baseline and stable Postop Assessment: no apparent nausea or vomiting Anesthetic complications: no   No notable events documented.   Last Vitals:  Vitals:   10/30/22 1156 10/30/22 1600  BP: (!) 158/99 (!) 179/83  Pulse: 94 95  Resp: 16 17  Temp: 36.6 C (!) 36.2 C  SpO2: 100% 99%    Last Pain:  Vitals:   10/30/22 1600  TempSrc:   PainSc: 0-No pain                 Louie Boston

## 2022-10-30 NOTE — Transfer of Care (Signed)
Immediate Anesthesia Transfer of Care Note  Patient: Tasha Wilson  Procedure(s) Performed: CYSTOSCOPY/URETEROSCOPY/HOLMIUM LASER/STENT PLACEMENT (Left)  Patient Location: PACU  Anesthesia Type:General  Level of Consciousness: drowsy  Airway & Oxygen Therapy: Patient Spontanous Breathing  Post-op Assessment: Report given to RN and Post -op Vital signs reviewed and stable  Post vital signs: Reviewed and stable  Last Vitals:  Vitals Value Taken Time  BP 179/83 10/30/22 1600  Temp 97.2   Pulse 92 10/30/22 1602  Resp 13 10/30/22 1602  SpO2 100 % 10/30/22 1602  Vitals shown include unvalidated device data.  Last Pain:  Vitals:   10/30/22 1156  TempSrc: Temporal  PainSc: 0-No pain         Complications: No notable events documented.

## 2022-10-30 NOTE — Anesthesia Preprocedure Evaluation (Signed)
Anesthesia Evaluation  Patient identified by MRN, date of birth, ID band Patient confused    Reviewed: Allergy & Precautions, NPO status , Patient's Chart, lab work & pertinent test results  History of Anesthesia Complications Negative for: history of anesthetic complications  Airway Mallampati: III  TM Distance: >3 FB Neck ROM: Full    Dental  (+) Edentulous Upper, Partial Lower   Pulmonary neg pulmonary ROS, neg sleep apnea, neg COPD, Patient abstained from smoking.Not current smoker   Pulmonary exam normal breath sounds clear to auscultation       Cardiovascular Exercise Tolerance: Good METS(-) hypertension+ DVT  (-) CAD and (-) Past MI (-) dysrhythmias  Rhythm:Regular Rate:Normal - Systolic murmurs    Neuro/Psych  PSYCHIATRIC DISORDERS     Dementia CVA, No Residual Symptoms    GI/Hepatic hiatal hernia,neg GERD  ,,(+)     (-) substance abuse    Endo/Other  neg diabetes  Morbid obesity  Renal/GU negative Renal ROS     Musculoskeletal   Abdominal  (+) + obese  Peds  Hematology   Anesthesia Other Findings Past Medical History: No date: Anemia No date: Cancer     Comment:  cervical- chemo/radiation 07/31/2014: Deep vein thrombosis (DVT) of lower extremity     Comment:  Bilateral - [facility] No date: Gout No date: High cholesterol No date: History of hiatal hernia No date: History of kidney stones No date: Hyperlipidemia 09/2022: Left ureteral stone No date: Neck pain     Comment:  s/p accident on 12/13/15.  has been seen by PCP No date: Osteoarthritis     Comment:  legs No date: Pneumonia No date: PONV (postoperative nausea and vomiting) 07/31/2014: Saddle pulmonary embolus     Comment:  [facility] No date: Stroke     Comment:  extensive superior sagittal and transverse venous               thrombosis No date: Subarachnoid hemorrhage No date: Wears dentures     Comment:  full upper and partial lower   Reproductive/Obstetrics                             Anesthesia Physical Anesthesia Plan  ASA: 3  Anesthesia Plan: General   Post-op Pain Management: Ofirmev IV (intra-op)*   Induction: Intravenous  PONV Risk Score and Plan: 4 or greater and Ondansetron and Dexamethasone  Airway Management Planned: LMA  Additional Equipment: None  Intra-op Plan:   Post-operative Plan: Extubation in OR  Informed Consent: I have reviewed the patients History and Physical, chart, labs and discussed the procedure including the risks, benefits and alternatives for the proposed anesthesia with the patient or authorized representative who has indicated his/her understanding and acceptance.     Dental advisory given and Consent reviewed with POA  Plan Discussed with: CRNA and Surgeon  Anesthesia Plan Comments: (Discussed risks of anesthesia with patient and POA daughter at bedside, including PONV, sore throat, lip/dental/eye damage. Rare risks discussed as well, such as cardiorespiratory and neurological sequelae, and allergic reactions. Discussed the role of CRNA in patient's perioperative care. Patient and daughter understands.)       Anesthesia Quick Evaluation

## 2022-10-30 NOTE — Discharge Instructions (Addendum)
DISCHARGE INSTRUCTIONS FOR KIDNEY STONE/URETERAL STENT   MEDICATIONS:  1. Resume all your other meds from home.  2.  AZO (over-the-counter) can help with the burning/stinging when you urinate. 3.  Trospium is for bladder spasm/frequency/urgency, Rx was sent to your pharmacy ([facility]).  ACTIVITY:  1. May resume regular activities in 24 hours. 2. No driving while on narcotic pain medications  3. Drink plenty of water  4. Continue to walk at home - you can still get blood clots when you are at home, so keep active, but don't over do it.  5. May return to work/school tomorrow or when you feel ready    SIGNS/SYMPTOMS TO CALL:  Common postoperative symptoms include urinary frequency, urgency, bladder spasm and blood in the urine  Please call us if you have a fever greater than 101.5, uncontrolled nausea/vomiting, uncontrolled pain, dizziness, unable to urinate, excessively bloody urine, chest pain, shortness of breath, leg swelling, leg pain, or any other concerns or questions.   You can reach [place] at [phone].   FOLLOW-UP:  1. You have a follow-up appointment scheduled 11/09/2022 for stent removal    AMBULATORY SURGERY  DISCHARGE INSTRUCTIONS   The drugs that you were given will stay in your system until tomorrow so for the next 24 hours you should not:  Drive an automobile Make any legal decisions Drink any alcoholic beverage   You may resume regular meals tomorrow.  Today it is better to start with liquids and gradually work up to solid foods.  You may eat anything you prefer, but it is better to start with liquids, then soup and crackers, and gradually work up to solid foods.   Please notify your doctor immediately if you have any unusual bleeding, trouble breathing, redness and pain at the surgery site, drainage, fever, or pain not relieved by medication.    Additional Instructions:     Please contact your physician with any problems or Same Day Surgery at  [phone], Monday through Friday 6 am to 4 pm, or [place] [facility] number at [phone].

## 2022-10-30 NOTE — Interval H&P Note (Signed)
History and Physical Interval Note:  CV:RRR Lungs:clear  10/30/2022 2:25 PM  Tasha Wilson  has presented today for surgery, with the diagnosis of Left Ureteral Stone.  The various methods of treatment have been discussed with the patient and family. After consideration of risks, benefits and other options for treatment, the patient has consented to  Procedure(s): CYSTOSCOPY/URETEROSCOPY/HOLMIUM LASER/STENT PLACEMENT (Left) as a surgical intervention.  The patient's history has been reviewed, patient examined, no change in status, stable for surgery.  I have reviewed the patient's chart and labs.  Questions were answered to the patient's satisfaction.     Ernie Sagrero C Enisa Runyan

## 2022-10-31 ENCOUNTER — Encounter: Payer: Self-pay | Admitting: Urology

## 2022-10-31 NOTE — Op Note (Signed)
   Preoperative diagnosis: Left distal ureteral calculus  Postoperative diagnosis: Left distal ureteral calculus  Procedure:  Cystoscopy Left ureteroscopy and stone removal Ureteroscopic laser lithotripsy Left ureteral stent placement (20F/22 cm)  Left retrograde pyelography with interpretation  Surgeon: Lorin Picket C. Brick Ketcher, M.D.  Anesthesia: General  Complications: None  Intraoperative findings:  Cystoscopy-bladder mucosa without erythema, solid or papillary lesions.  UOs normal-appearing bilaterally Ureteropyeloscopy-left distal ureteral calculus.  Marked hydroureter proximal to the stone.  Marked renal pelvis/calyceal dilation.  All calyces examined under fluoroscopy and no calculus identified in the left collecting system Left retrograde pyelography post procedure showed severe hydronephrosis/calyceal dilation  EBL: Minimal  Specimens: Calculus fragments for analysis   Indication: Tasha Wilson is a 77 y.o. female who on a recent ED visit was found to have a 7 mm left distal ureteral calculus with severe hydronephrosis/hydroureter.  She was asymptomatic after initial presentation however the calculus was still present on KUB.  After reviewing the management options for treatment, the patient elected to proceed with the above surgical procedure(s). We have discussed the potential benefits and risks of the procedure, side effects of the proposed treatment, the likelihood of the patient achieving the goals of the procedure, and any potential problems that might occur during the procedure or recuperation. Informed consent has been obtained.  Description of procedure:  The patient was taken to the operating room and general anesthesia was induced.  The patient was placed in the dorsal lithotomy position, prepped and draped in the usual sterile fashion, and preoperative antibiotics were administered. A preoperative time-out was performed.   A 21 French cystoscope sheath with  obturator was lubricated and placed per urethra.  The sheath was exchanged for a 30 degree lens and panendoscopy was performed with findings as described above.  Attention was directed to the left ureteral orifice and a 0.038 Sensor wire was then advanced up the ureter into the renal pelvis under fluoroscopic guidance.  A 4.5 Fr semirigid ureteroscope was then advanced into the ureter next to the guidewire and the calculus was identified as described above.  The stone was then dusted with a 200 m Moses holmium laser fiber at a setting of 0.3 J/40 hz.   Fragments chipped off the calculus during dusting were then removed from the ureter with a zero tip nitinol basket.  Reinspection of the ureter revealed no remaining visible stones or fragments.   A second Sensor wire was placed through the semirigid ureteroscope followed by scope removal.  A single channel digital flexible ureteroscope was then advanced over the working wire into the distal ureter.  The guidewire was removed and the ureteroscope was easily advanced into the renal pelvis.  All calyces were examined and no renal calculus was identified.  Several calyces had Randall's plaques.  Retrograde pyelogram was then performed through the ureteroscope and all calyces were examined under fluoroscopic guidance without a renal calculus identified.  The ureteroscope was then removed.  A 6 FR/22 cm Contour ureteral stent was placed under fluoroscopic guidance.  The wire was then removed with an adequate stent curl noted in an upper pole calyx as well as in the bladder.  The bladder was then emptied and the procedure ended.  The patient appeared to tolerate the procedure well and without complications.  After anesthetic reversal the patient was transported to the PACU in stable condition.   Plan: She will be scheduled for office follow-up with stent removal and 7-10 days   Irineo Axon, MD

## 2022-11-05 LAB — CALCULI, WITH PHOTOGRAPH (CLINICAL LAB)
Calcium Oxalate Monohydrate: 100 %
Weight Calculi: 8 mg

## 2022-11-09 ENCOUNTER — Ambulatory Visit (INDEPENDENT_AMBULATORY_CARE_PROVIDER_SITE_OTHER): Payer: Medicare Other | Admitting: Urology

## 2022-11-09 ENCOUNTER — Encounter: Payer: Self-pay | Admitting: Urology

## 2022-11-09 VITALS — BP 115/60 | HR 91 | Ht 64.0 in | Wt 234.0 lb

## 2022-11-09 DIAGNOSIS — Z466 Encounter for fitting and adjustment of urinary device: Secondary | ICD-10-CM

## 2022-11-09 LAB — URINALYSIS, COMPLETE
Bilirubin, UA: NEGATIVE
Glucose, UA: NEGATIVE
Ketones, UA: NEGATIVE
Nitrite, UA: NEGATIVE
Specific Gravity, UA: 1.015 (ref 1.005–1.030)
Urobilinogen, Ur: 0.2 mg/dL (ref 0.2–1.0)
pH, UA: 6.5 (ref 5.0–7.5)

## 2022-11-09 LAB — MICROSCOPIC EXAMINATION: RBC, Urine: >30 /hpf — AB (ref 0–2)

## 2022-11-09 MED ORDER — SULFAMETHOXAZOLE-TRIMETHOPRIM 800-160 MG PO TABS
1.0000 | ORAL_TABLET | Freq: Once | ORAL | Status: AC
Start: 2022-11-09 — End: 2022-11-09
  Administered 2022-11-09: 1 via ORAL

## 2022-11-09 NOTE — Progress Notes (Signed)
   Indications: Patient is 77 y.o., who is s/p ureteroscopic removal of a 7 mm left distal ureteral calculus/9/24.  She had no postoperative complaints.  The patient is presenting today for stent removal.  Procedure:  Flexible Cystoscopy with stent removal ([address])  Timeout was performed and the correct patient, procedure and participants were identified.    Description:  The patient was prepped and draped in the usual sterile fashion. Flexible cystosopy was performed.  The stent was visualized, grasped, and removed intact without difficulty. The patient tolerated the procedure well.  A single dose of oral antibiotics was given.  Complications:  None  Plan:  Stone analysis 100% calcium oxalate monohydrate Provided stone prevention literature 29-month follow-up with KUB Instructed to call for fever/flank pain post stent removal   Irineo Axon, MD

## 2023-05-16 ENCOUNTER — Other Ambulatory Visit: Payer: Self-pay | Admitting: *Deleted

## 2023-05-16 DIAGNOSIS — N201 Calculus of ureter: Secondary | ICD-10-CM

## 2023-05-20 ENCOUNTER — Ambulatory Visit
Admission: RE | Admit: 2023-05-20 | Discharge: 2023-05-20 | Disposition: A | Discharge status: Home / Self Care | Payer: Medicare Other | Source: Ambulatory Visit | Attending: Urology | Admitting: Urology

## 2023-05-20 ENCOUNTER — Encounter: Payer: Self-pay | Admitting: Urology

## 2023-05-20 ENCOUNTER — Ambulatory Visit (INDEPENDENT_AMBULATORY_CARE_PROVIDER_SITE_OTHER): Payer: Medicare Other | Admitting: Urology

## 2023-05-20 ENCOUNTER — Ambulatory Visit
Admission: RE | Admit: 2023-05-20 | Discharge: 2023-05-20 | Disposition: A | Discharge status: Home / Self Care | Payer: Medicare Other | Attending: Urology | Admitting: Urology

## 2023-05-20 VITALS — BP 131/81 | HR 80 | Ht 64.0 in | Wt 240.0 lb

## 2023-05-20 DIAGNOSIS — N201 Calculus of ureter: Secondary | ICD-10-CM | POA: Insufficient documentation

## 2023-05-20 DIAGNOSIS — N200 Calculus of kidney: Secondary | ICD-10-CM

## 2023-05-20 NOTE — Progress Notes (Signed)
I, Maysun Anabel Bene, acting as a scribe for Riki Altes, MD., have documented all relevant documentation on the behalf of Riki Altes, MD, as directed by Riki Altes, MD while in the presence of Riki Altes, MD.  05/20/2023 11:16 AM   Tasha Wilson Nov 04, 1945 [id number]  Referring provider: Emogene Morgan, MD [address] [place],  [place] [address]  Chief Complaint  Patient presents with   Nephrolithiasis   Urologic history: 1. Nephrolithiasis Ureteroscopic removal of 7mm left distal ureteral calculus 10/30/2022.  Also had a non-restructing left renal calculus, which was not identified on ureteropyloscopy.  Stone analysis 100% calcium oxalate monohydrate.   HPI: Tasha Wilson is a 77 y.o. female presents for the 6 month follow-up.  No problems since her last visit.  Specifically denies flank, abdominal or pelvic pain.  No bothersome lower urinary tract symptoms or gross hematuria.    PMH: Past Medical History:  Diagnosis Date   Anemia    Cancer (HCC)    cervical- chemo/radiation   Deep vein thrombosis (DVT) of lower extremity (HCC) 07/31/2014   Bilateral - [facility]   Gout    High cholesterol    History of hiatal hernia    History of kidney stones    Hyperlipidemia    Left ureteral stone 09/2022   Neck pain    s/p accident on 12/13/15.  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   Subarachnoid hemorrhage (HCC)    Wears dentures    full upper and partial lower    Surgical History: Past Surgical History:  Procedure Laterality Date   CATARACT EXTRACTION W/ INTRAOCULAR LENS IMPLANT Left    CERVICAL BIOPSY  W/ LOOP ELECTRODE EXCISION  2011   CHOLECYSTECTOMY     COLONOSCOPY WITH PROPOFOL N/A 12/30/2015   Procedure: COLONOSCOPY WITH PROPOFOL;   Surgeon: Midge Minium, MD;  Location: [facility];  Service: Endoscopy;  Laterality: N/A;   CYSTOSCOPY/URETEROSCOPY/HOLMIUM LASER/STENT PLACEMENT Left 10/30/2022   Procedure: CYSTOSCOPY/URETEROSCOPY/HOLMIUM LASER/STENT PLACEMENT;  Surgeon: Riki Altes, MD;  Location: [facility];  Service: Urology;  Laterality: Left;   HIATAL HERNIA REPAIR  07/02/2014   [facility]   IVC FILTER PLACEMENT ([facility] HX)     [facility] - placed 08/02/14, removed 09/28/14   NISSEN FUNDOPLICATION  07/02/2014   [facility]   REPLACEMENT TOTAL KNEE Left 2000    Home Medications:  Allergies as of 05/20/2023   Not on File      Medication List        Accurate as of May 20, 2023 11:16 AM. If you have any questions, ask your nurse or doctor.          acetaminophen 500 MG tablet Commonly known as: TYLENOL Take 1,000 mg by mouth every 6 (six) hours as needed.   cyanocobalamin 1000 MCG tablet Commonly known as: VITAMIN B12 Take 1,000 mcg by mouth daily.   Eliquis 5 MG Tabs tablet Generic drug: apixaban Take 5 mg by mouth 2 (two) times daily.   lamoTRIgine 150 MG tablet Commonly known as: LAMICTAL Take 75 mg by mouth 2 (two) times daily.   lovastatin 20 MG tablet Commonly known as: MEVACOR Take 20 mg by mouth at bedtime.   QUEtiapine 25 MG tablet  Commonly known as: SEROQUEL Take 25 mg by mouth at bedtime.   trospium 20 MG tablet Commonly known as: SANCTURA Take 1 tablet (20 mg total) by mouth 2 (two) times daily as needed (Urinary frequency, urgency, bladder spasm).   Vitamin D3 25 MCG (1000 UT) Caps Take 1,000 Units by mouth daily.        Allergies: Not on File  Family History: Family History  Problem Relation Age of Onset   Breast cancer Neg Hx     Social History:  reports that she has never smoked. She has never used smokeless tobacco. She reports that she does not drink alcohol and does not use drugs.   Physical Exam: BP 131/81   Pulse 80   Ht 5\' 4"  (1.626 m)   Wt 240 lb (108.9 kg)    BMI 41.20 kg/m   Constitutional:  Alert, No acute distress. HEENT: [place] AT Respiratory: Normal respiratory effort, no increased work of breathing. Psychiatric: Normal mood and affect.   Pertinent Imaging: KUB performed this morning was personally reviewed and interpreted. The left renal calculus is visualized overlying the superior portion of the left renal outline without any increased interval growth. The images have not yet been interpreted by radiology.    Assessment & Plan:    1. Left nephrolithiasis Non-obstructing left renal calculus, which was not able to be identified on pyeloscopy.  1 year follow-up with KUB.  Instructed to call earlier for flank pain/renal colic.   I have reviewed the above documentation for accuracy and completeness, and I agree with the above.   Riki Altes, MD  [facility] [address], [address] [place], [place] [address] [phone]

## 2023-12-23 NOTE — Progress Notes (Signed)
 Ref Provider: Caffaro, Kaitlin, PA PCP: Dr. Lorel Byars Achirimofor, MD  Assessment and Plan:   In most patients, we give written parts of the assessment and plan to put the patient under Patient Instructions/After Visit Summary. So some parts are directed to the patient.  Dear [name], It was our pleasure to participate in your care in person. We have typed up a summary of what we discussed.  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 Memory is reportedly well-managed with occasional fluctuations, particularly in short-term memory. No seizures since the last stroke-related event. Advised to minimize passive activities like TV watching and encouraged to engage in cognitive and physical activities. Emphasized the importance of a healthy diet and regular exercise for cognitive support.  - Refill Seroquel 25 mg nightly - Refill Lamotrigine 75 mg twice a day - Encourage engagement in brain-stimulating activities - Advise walking 15-20 minutes a day, 4-5 days a week - Recommend healthy diet with green leafy vegetables, fruits, nuts, beans, and legumes  SnoreLab is one of the applications available for iPhone and android smart phones which will track and record snoring overnight.  This will allow the patient to listen to and review snoring and to determine the effectiveness of treatments.  For more information visit [email or web address]  2. 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  3. 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  4. Low vitamin B12 Low vitamin B12.  Follow-up Plan to follow up in one year unless issues arise sooner. 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 This note has been created using automated tools and reviewed for accuracy by [facility]. I spent a total of 31 minutes in both face-to-face and non-face-to-face activities, excluding procedures performed, for this visit on the date of this encounter.  Interim History date 12/25/2023   [name] is a 78 y.o. female here for treatment and evaluation of   [name] last visit was on 03/12/2023  Patient states memory is the same. Some days are worse. Her short term memory is worse forgetting what was told to her. Patient states seizures are not present . Last seizure reported when in the hospital when she had a stroke.  History of Present Illness Tasha Wilson is a 78 year old female with a history of stroke and neurodegenerative disease who presents for follow-up of memory issues. She is accompanied by her daughter, who assists with her medications.  Her memory remains unchanged, with fluctuations in severity, experiencing worsening short-term memory and often forgetting recent conversations. No seizures have occurred since her last reported seizure during a hospitalization for a stroke.  Her medical history includes a significant event on Dec 16, 2016, with extensive superior central sinus and transverse sinus venous thrombosis, and a right parietal occipital intraparenchymal hemorrhage with intraventricular and subarachnoid hemorrhage on March 15, 2017.  She is currently taking Eliquis.  She has been tried on levetiracetam in the past but is now on lamotrigine 75 mg twice a day as an anti-seizure medication. Additionally, she takes Seroquel 25 mg nightly.  She engages in household chores such as cleaning, mopping, and doing dishes. She also pushes her trash can to the road and back and sweeps her porch. She watches TV, particularly game shows, and talks to her friends. Her nutrition includes cooking for herself and consuming green leafy vegetables, fruits, nuts, beans, and legumes, while trying to avoid ultra-processed foods.  She reports sleeping okay but does snore. She has not been evaluated for sleep apnea, and her daughter is not aware if she stops breathing during sleep. She does not use a smartphone for activities other than making calls.  I reviewed labs, imaging, and notes in [place], [place], and from outside providers, if available.   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 is a right handed 78 y.o. female  here for evaluation of No chief complaint on file.  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  Daughter states her memory has gotten worse since her last visit.  She has become agitated.  She has trouble remembering things that were once familiar to her.  Daughter states she is fearful for living on her own.  She manages her own medications - not taking it correctly.  She no longer cooks. Forgets to  take out trash, needs instructions on how to do it. She is not longer driving, stopped in [address]. She currently lives alone, but her sister lives down the street from her.   Per daughter patient's memory has gotten a little worse.  She is overall more forgetful and increased confusion.    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 Patient reports her headache was located in the frontal region of her head.  She  Presented to [facility] ED with complaints of severe headache and episode of intermittent unresponsiveness, left side weakness and blurred vision.  She had a stroke work up done including  CT and MRI Brain which showed right occipital intraparenchymal hemorrhage with intraventricular spread and SAH. Initial NIHSS was 12.  She also had a CTV which showed superior sagittal sinus and transverse sinus thrombus. She was treated with heparin gtt and later switched to Apixaban.  Since the event she is not able to drive and walk. Patient is using personal cane and wheelchair during office visit today.   Brandye Niday has known stroke risk factors of hypertension and hyperlipidemia.  She has history of DVT, pulmonary embolism and neoplasm  She has received rehabilitation at [facility] and [facility] since the event. She state that her headaches has improved and she continues to work with Physical Therapy.  She reports no known history of atrial fibrillation, sickle cell disease or trait, post-menopausal hormone replacement therapy, drug abuse, migraine with aura, periodontal disease, high CRP, elevated lipoprotein A, increased homocysteine level, peripheral vascular disease coagulopathy, Cox II inhibitor use.   CT head 12/16/2016: Large right  occipital intraparenchymal hemorrhage with intraventricular spread, including the bilateral lateral ventricles and third and fourth  ventricles.  Etiology includes amyloid angiopathy versus hemorrhagic conversion of an ischemic stroke with underlying mass less likely.  Medication: Eliquis  Right parietal occipital Intraparenchymal hemorrhage with Intraventricular hemorrhage and Subarachnoid hemorrhage: MRI brain: 03-15-2017, Old hemorrhagic infarction in the right posterior parietal region. Atrophy encephalomalacia with gliosis. Residual hemosiderin deposition. Areas of enhancement along the margins of the old hemorrhagic component. Minimal small vessel change of the hemispheric white matter elsewhere. CTV on 5/31/20187 and repeat CTV on 12/22/2016: Extensive venous sinus thrombosis ECHO 12/18/2016: Normal  Cardiac Rhythm monitoring:   Medication: Lamotrigine  Seizures: taking Keppra   EEG 12/22/2016: abnormal due to right hemisphere slowing and occasional right hemisphere sharps  Physical Exam   Vitals Vitals:   12/25/23 0845  BP: 130/80  Pulse: 64  SpO2: 100%  Weight: (!) 111.1 kg (245 lb)  Height: 162.6 cm (5' 4)  PainSc: 0-No pain     Body mass index is 42.05 kg/m.  (Some of the exam changes noted are from previous clinical observations)  General Exam     Neurological Exam 12/25/23 Ms. Armwood was asked to count months of the year backward - Patient was able to do it without any problems. Patient was able to do it but took long time and hesitated. Patient was able to do it only from month Dec to  Patient was not able to do it.   Other Historical Findings  - Correct: president and former, day of week, year  - Correct: day of week, location, children, grandchildren  - Immediate recall (registration) 3/3 and delayed recall 3/3.  OTHER HISTORICAL FINDINGS  Ms. Vanderweide was asked to count months of the year backward - Patient was not able to do it very well.  Friday,  Correct home address, phone number, grand kids name Decreased hearing bilaterally  Recurrent movement of the lips  upper and lwoer  Mild generalized age appropriate loss of muscle mass but strength seems 5/5, no focal atrophy, fasciculations seen.   Mild weakness on the left Patient has decreased light touch, vibration and temperature sensation in lower extremities Romberg - unable to perform , unsteady on feet Patients gait was slow and unsteady.   Physical Exam     Medications: Current Outpatient Medications on File Prior to Visit  Medication Sig Dispense Refill  . acetaminophen  (TYLENOL ) 500 MG tablet Take 1,000 mg by mouth every 6 (six) hours as needed    . apixaban (ELIQUIS) 5 mg tablet Take 5 mg by mouth every 12 (twelve) hours    . cholecalciferol (VITAMIN D3) 1000 unit tablet Take by mouth    . cyanocobalamin (VITAMIN B12) 1000 MCG tablet Take 1,000 mcg by mouth once daily    . lovastatin (MEVACOR) 20 MG tablet Take 20 mg by mouth daily with dinner.    . cyanocobalamin (VITAMIN B12) 1,000 mcg/mL injection Inject 1 mL (1,000 mcg total) into the muscle once a week For 4 weeks, then once a month for 4 months (Patient not taking: Reported on 12/25/2023) 1 mL 8  . cyanocobalamin (VITAMIN B12) 1,000 mcg/mL injection Inject 1 mL (1,000 mcg total) into the muscle once a week For 4 weeks, than once a month for 4 months (Patient not taking: Reported on 03/12/2023) 8 mL 0   No current facility-administered medications on file prior to visit.   Past Medical History:  Past Medical History:  Diagnosis Date  . Arthritis   .  DVT (deep venous thrombosis) (CMS/HHS-HCC) 11/2010  . Gout   . Hyperlipidemia   . Hypertension   . PE (pulmonary embolism) (CMS/HHS-HCC) 11/2010  . Stroke (CMS/HHS-HCC)     Past Surgical History:  Past Surgical History:  Procedure Laterality Date  . JOINT REPLACEMENT  2000   knee replacement   . CERVICAL BIOPSY  W/ LOOP ELECTRODE EXCISION  2011  . REPLACEMENT TOTAL KNEE Left    Family History:  Family History  Problem Relation Name Age of Onset  . Pancreatic cancer Sister  25   . Cancer Brother         cancer unknown origin   Social History:  Social History   Socioeconomic History  . Marital status: Divorced  Tobacco Use  . Smoking status: Never  . Smokeless tobacco: Never  Vaping Use  . Vaping status: Never Used  Substance and Sexual Activity  . Alcohol use: No  . Drug use: No  . Sexual activity: Not Currently   Social Drivers of Health   Housing Stability: Unknown (12/04/2023)   Housing Stability Vital Sign   . Homeless in the Last Year: No   Allergies: No Known Allergies   Dr. Jannett Fairly

## 2024-03-18 ENCOUNTER — Other Ambulatory Visit: Payer: Self-pay | Admitting: Family Medicine

## 2024-03-18 DIAGNOSIS — Z780 Asymptomatic menopausal state: Secondary | ICD-10-CM

## 2024-05-20 ENCOUNTER — Other Ambulatory Visit: Payer: Self-pay | Admitting: *Deleted

## 2024-05-20 ENCOUNTER — Ambulatory Visit
Admission: RE | Admit: 2024-05-20 | Discharge: 2024-05-20 | Disposition: A | Discharge status: Home / Self Care | Source: Ambulatory Visit | Attending: Urology | Admitting: Urology

## 2024-05-20 ENCOUNTER — Ambulatory Visit: Payer: Self-pay | Admitting: Urology

## 2024-05-20 ENCOUNTER — Encounter: Payer: Self-pay | Admitting: Urology

## 2024-05-20 VITALS — BP 142/90 | HR 92 | Wt 240.9 lb

## 2024-05-20 DIAGNOSIS — N200 Calculus of kidney: Secondary | ICD-10-CM | POA: Insufficient documentation

## 2024-05-20 NOTE — Progress Notes (Signed)
 05/20/2024 5:53 PM   Tasha Wilson 01-27-1946 [id number]  Referring provider: Lorel Maxie LABOR, MD [address] [place],  [place] [address]  Chief Complaint  Patient presents with   Nephrolithiasis   Urologic history: 1. Nephrolithiasis Ureteroscopic removal of 7mm left distal ureteral calculus 10/30/2022.  Also had a non-restructing left renal calculus, which was not identified on ureteropyloscopy.  Stone analysis 100% calcium oxalate monohydrate.   HPI: Tasha Wilson is a 78 y.o. female who presents for annual follow-up  No problems since her last visit.  Specifically denies flank, abdominal or pelvic pain.  No bothersome lower urinary tract symptoms or gross hematuria.    PMH: Past Medical History:  Diagnosis Date   Anemia    Cancer (HCC)    cervical- chemo/radiation   Deep vein thrombosis (DVT) of lower extremity (HCC) 07/31/2014   Bilateral - [facility]   Gout    High cholesterol    History of hiatal hernia    History of kidney stones    Hyperlipidemia    Left ureteral stone 09/2022   Neck pain    s/p accident on 12/13/15.  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   Subarachnoid hemorrhage (HCC)    Wears dentures    full upper and partial lower    Surgical History: Past Surgical History:  Procedure Laterality Date   CATARACT EXTRACTION W/ INTRAOCULAR LENS IMPLANT Left    CERVICAL BIOPSY  W/ LOOP ELECTRODE EXCISION  2011   CHOLECYSTECTOMY     COLONOSCOPY WITH PROPOFOL  N/A 12/30/2015   Procedure: COLONOSCOPY WITH PROPOFOL ;  Surgeon: Rogelia Copping, MD;  Location: [facility];  Service: Endoscopy;  Laterality: N/A;   CYSTOSCOPY/URETEROSCOPY/HOLMIUM LASER/STENT PLACEMENT Left 10/30/2022   Procedure: CYSTOSCOPY/URETEROSCOPY/HOLMIUM LASER/STENT PLACEMENT;   Surgeon: Twylla Glendia BROCKS, MD;  Location: [facility];  Service: Urology;  Laterality: Left;   HIATAL HERNIA REPAIR  07/02/2014   [facility]   IVC FILTER PLACEMENT ([facility] HX)     [facility] - placed 08/02/14, removed 09/28/14   NISSEN FUNDOPLICATION  07/02/2014   [facility]   REPLACEMENT TOTAL KNEE Left 2000    Home Medications:  Allergies as of 05/20/2024   No Known Allergies      Medication List        Accurate as of May 20, 2024  5:53 PM. If you have any questions, ask your nurse or doctor.          STOP taking these medications    trospium  20 MG tablet Commonly known as: SANCTURA  Stopped by: Glendia BROCKS Twylla       TAKE these medications    acetaminophen  500 MG tablet Commonly known as: TYLENOL  Take 1,000 mg by mouth every 6 (six) hours as needed.   allopurinol 100 MG tablet Commonly known as: ZYLOPRIM Take 100 mg by mouth daily.   colchicine  0.6 MG tablet Take 0.6 mg by mouth daily.   cyanocobalamin 1000 MCG/ML injection Commonly known as: VITAMIN B12 Inject 1,000 mcg into the muscle. Inject 1 mL (1,000 mcg total) into the muscle once a week For 4 weeks, then once a month for 4 months What changed: Another medication with the same name was removed. Continue taking this medication, and follow the directions you see here. Changed by:  Campbell Kray C Windsor Zirkelbach   Eliquis 5 MG Tabs tablet Generic drug: apixaban Take 5 mg by mouth 2 (two) times daily.   lamoTRIgine 150 MG tablet Commonly known as: LAMICTAL Take 75 mg by mouth 2 (two) times daily.   lovastatin 20 MG tablet Commonly known as: MEVACOR Take 20 mg by mouth at bedtime.   QUEtiapine 25 MG tablet Commonly known as: SEROQUEL Take 25 mg by mouth at bedtime.   Vitamin D3 25 MCG (1000 UT) Caps Take 1,000 Units by mouth daily.        Allergies: No Known Allergies  Family History: Family History  Problem Relation Age of Onset   Breast cancer Neg Hx     Social History:  reports that she has never smoked. She has  never used smokeless tobacco. She reports that she does not drink alcohol and does not use drugs.   Physical Exam: BP (!) 142/90 (BP Location: Left Arm, Patient Position: Sitting, Cuff Size: Large)   Pulse 92   Wt 240 lb 14.4 oz (109.3 kg)   SpO2 98%   BMI 41.35 kg/m   Constitutional:  Alert, No acute distress. HEENT: [place] AT Respiratory: Normal respiratory effort, no increased work of breathing. Psychiatric: Normal mood and affect.   Pertinent Imaging: KUB performed prior to her appoint was personally reviewed and interpreted.  Stable calcification overlying the left renal outline.   Assessment & Plan:    1. Left nephrolithiasis Non-obstructing left renal calculus, which was not able to be identified on pyeloscopy.  1 year follow-up with KUB.  Instructed to call earlier for flank pain/renal colic.  Glendia JAYSON Barba, MD  [facility] [address], [address] [place], [place] [address] [phone]

## 2025-05-21 ENCOUNTER — Ambulatory Visit: Admitting: Urology
